# Patient Record
Sex: Male | Born: 1975 | Race: White | Hispanic: No | Marital: Married | Smoking: Former smoker
Health system: Southern US, Community
[De-identification: ages and names within clinical notes are randomized; demographics above are authoritative.]

## PROBLEM LIST (undated history)

## (undated) DIAGNOSIS — K219 Gastro-esophageal reflux disease without esophagitis: Secondary | ICD-10-CM

## (undated) DIAGNOSIS — R519 Headache, unspecified: Secondary | ICD-10-CM

## (undated) DIAGNOSIS — R51 Headache: Secondary | ICD-10-CM

## (undated) HISTORY — PX: JOINT REPLACEMENT: SHX530

## (undated) HISTORY — DX: Headache, unspecified: R51.9

## (undated) HISTORY — DX: Headache: R51

## (undated) HISTORY — DX: Gastro-esophageal reflux disease without esophagitis: K21.9

---

## 1999-04-24 ENCOUNTER — Emergency Department (HOSPITAL_COMMUNITY): Admission: EM | Admit: 1999-04-24 | Discharge: 1999-04-24 | Payer: Self-pay | Admitting: *Deleted

## 2000-09-08 ENCOUNTER — Other Ambulatory Visit: Admission: RE | Admit: 2000-09-08 | Discharge: 2000-09-08 | Payer: Self-pay | Admitting: Orthopaedic Surgery

## 2001-05-27 ENCOUNTER — Emergency Department (HOSPITAL_COMMUNITY): Admission: EM | Admit: 2001-05-27 | Discharge: 2001-05-27 | Payer: Self-pay | Admitting: Emergency Medicine

## 2001-05-27 ENCOUNTER — Encounter: Payer: Self-pay | Admitting: Emergency Medicine

## 2008-06-03 ENCOUNTER — Ambulatory Visit (HOSPITAL_COMMUNITY): Admission: RE | Admit: 2008-06-03 | Discharge: 2008-06-03 | Payer: Self-pay | Admitting: Family Medicine

## 2010-12-30 ENCOUNTER — Emergency Department (HOSPITAL_BASED_OUTPATIENT_CLINIC_OR_DEPARTMENT_OTHER)
Admission: EM | Admit: 2010-12-30 | Discharge: 2010-12-30 | Disposition: A | Discharge status: Home / Self Care | Payer: BC Managed Care – PPO | Attending: Emergency Medicine | Admitting: Emergency Medicine

## 2010-12-30 ENCOUNTER — Emergency Department (INDEPENDENT_AMBULATORY_CARE_PROVIDER_SITE_OTHER): Payer: BC Managed Care – PPO

## 2010-12-30 ENCOUNTER — Encounter: Payer: Self-pay | Admitting: *Deleted

## 2010-12-30 DIAGNOSIS — F17200 Nicotine dependence, unspecified, uncomplicated: Secondary | ICD-10-CM | POA: Insufficient documentation

## 2010-12-30 DIAGNOSIS — R1032 Left lower quadrant pain: Secondary | ICD-10-CM | POA: Insufficient documentation

## 2010-12-30 DIAGNOSIS — R112 Nausea with vomiting, unspecified: Secondary | ICD-10-CM

## 2010-12-30 LAB — COMPREHENSIVE METABOLIC PANEL
BUN: 11 mg/dL (ref 6–23)
Calcium: 9.8 mg/dL (ref 8.4–10.5)
GFR calc Af Amer: >60 mL/min (ref >60)
Glucose, Bld: 97 mg/dL (ref 70–99)
Sodium: 137 mEq/L (ref 135–145)
Total Protein: 7.3 g/dL (ref 6.0–8.3)

## 2010-12-30 LAB — URINALYSIS, ROUTINE W REFLEX MICROSCOPIC
Leukocytes, UA: NEGATIVE
Nitrite: NEGATIVE
Specific Gravity, Urine: >1.046 — ABNORMAL HIGH (ref 1.005–1.030)
pH: 6.5 (ref 5.0–8.0)

## 2010-12-30 LAB — CBC
Hemoglobin: 15.7 g/dL (ref 13.0–17.0)
MCH: 30.5 pg (ref 26.0–34.0)
MCHC: 35.6 g/dL (ref 30.0–36.0)

## 2010-12-30 MED ORDER — SODIUM CHLORIDE 0.9 % IV BOLUS (SEPSIS)
1000.0000 mL | Freq: Once | INTRAVENOUS | Status: AC
  Administered 2010-12-30: 1000 mL via INTRAVENOUS

## 2010-12-30 MED ORDER — IOHEXOL 300 MG/ML  SOLN
100.0000 mL | Freq: Once | INTRAMUSCULAR | Status: AC | PRN
  Administered 2010-12-30: 100 mL via INTRAVENOUS

## 2010-12-30 MED ORDER — HYDROMORPHONE HCL 1 MG/ML IJ SOLN: 1.0000 mg | Freq: Once | INTRAMUSCULAR | Status: DC

## 2010-12-30 MED ORDER — SODIUM CHLORIDE 0.9 % IV BOLUS (SEPSIS)
1000.0000 mL | Freq: Once | INTRAVENOUS | Status: DC
  Administered 2010-12-30: 1000 mL via INTRAVENOUS

## 2010-12-30 MED ORDER — ONDANSETRON HCL 4 MG/2ML IJ SOLN
4.0000 mg | Freq: Once | INTRAMUSCULAR | Status: AC
  Administered 2010-12-30: 4 mg via INTRAVENOUS

## 2010-12-30 MED ORDER — ONDANSETRON HCL 4 MG PO TABS: 8.0000 mg | ORAL_TABLET | Freq: Three times a day (TID) | ORAL | Status: AC | PRN

## 2010-12-30 MED ORDER — ONDANSETRON HCL 4 MG/2ML IJ SOLN
4.0000 mg | Freq: Once | INTRAMUSCULAR | Status: AC
Start: 2010-12-30 — End: 2010-12-30
  Administered 2010-12-30: 4 mg via INTRAVENOUS

## 2010-12-30 NOTE — ED Notes (Signed)
Pt c/o generalized abd pain with n/v x 1 day 

## 2010-12-30 NOTE — ED Notes (Signed)
Dr. Campos at bedside   

## 2010-12-30 NOTE — ED Notes (Signed)
Pt states he is unable to urinate. Informed pt to notify staff ASAP, as a specimen is needed.

## 2010-12-30 NOTE — ED Provider Notes (Signed)
History     CSN: [id number] Arrival date & time: 12/30/2010  8:20 PM  Chief Complaint  Patient presents with  . Emesis  . Abdominal Pain   Patient is a 35 y.o. male presenting with vomiting. The history is provided by the patient.  Emesis  This is a new problem. Episode onset: 1 day ago. The problem occurs 5 to 10 times per day. The problem has not changed since onset.The emesis has an appearance of stomach contents (Nonbloody and nonbilious). There has been no fever. Associated symptoms include abdominal pain. Pertinent negatives include no chills, no diarrhea, no fever and no headaches. Associated symptoms comments: Denies dysuria or penile discharge.  Denies flank pain or fever or chills. .    History reviewed. No pertinent past medical history.  Past Surgical History  Procedure Date  . Joint replacement     History reviewed. No pertinent family history.  History  Substance Use Topics  . Smoking status: Current Everyday Smoker -- 0.5 packs/day  . Smokeless tobacco: Not on file  . Alcohol Use: No      Review of Systems  Constitutional: Negative for fever and chills.  Gastrointestinal: Positive for vomiting and abdominal pain. Negative for diarrhea.  Neurological: Negative for headaches.  All other systems reviewed and are negative.    Physical Exam  BP 114/77  Pulse 86  Temp(Src) 98.7 F (37.1 C) (Oral)  Resp 16  Wt 155 lb (70.308 kg)  Physical Exam  Nursing note and vitals reviewed. Constitutional: He is oriented to person, place, and time. He appears well-developed and well-nourished.  HENT:  Head: Normocephalic and atraumatic.       HEENT his membranes are dry   Eyes: EOM are normal.  Neck: Normal range of motion.  Cardiovascular: Normal rate, regular rhythm, normal heart sounds and intact distal pulses.   Pulmonary/Chest: Effort normal and breath sounds normal. No respiratory distress.  Abdominal: Soft. He exhibits no distension. There is no  tenderness.  Musculoskeletal: Normal range of motion.  Neurological: He is alert and oriented to person, place, and time.  Skin: Skin is warm and dry.  Psychiatric: He has a normal mood and affect. Judgment normal.    ED Course  Procedures  MDM I personally reviewed the labs and radiographs from today's visit.  Given mild left-sided abdominal tenderness a CT scan was performed.  Patient's symptoms are improved at this time.  His vital signs are normal. Pt to be discharged home with antibiotics and instructions for ongoing PO hydration. Feels much better at this time  Results for orders placed during the hospital encounter of 12/30/10  CBC      Component Value Range   WBC 8.4  4.0 - 10.5 (K/uL)   RBC 5.15  4.22 - 5.81 (MIL/uL)   Hemoglobin 15.7  13.0 - 17.0 (g/dL)   HCT 47.4  25.9 - 56.3 (%)   MCV 85.6  78.0 - 100.0 (fL)   MCH 30.5  26.0 - 34.0 (pg)   MCHC 35.6  30.0 - 36.0 (g/dL)   RDW 87.5  64.3 - 32.9 (%)   Platelets 219  150 - 400 (K/uL)  COMPREHENSIVE METABOLIC PANEL      Component Value Range   Sodium 137  135 - 145 (mEq/L)   Potassium 3.6  3.5 - 5.1 (mEq/L)   Chloride 101  96 - 112 (mEq/L)   CO2 26  19 - 32 (mEq/L)   Glucose, Bld 97  70 - 99 (mg/dL)  BUN 11  6 - 23 (mg/dL)   Creatinine, Ser 1.61  0.50 - 1.35 (mg/dL)   Calcium 9.8  8.4 - 09.6 (mg/dL)   Total Protein 7.3  6.0 - 8.3 (g/dL)   Albumin 3.8  3.5 - 5.2 (g/dL)   AST 18  0 - 37 (U/L)   ALT 11  0 - 53 (U/L)   Alkaline Phosphatase 65  39 - 117 (U/L)   Total Bilirubin 0.5  0.3 - 1.2 (mg/dL)   GFR calc non Af Amer >60  >60 (mL/min)   GFR calc Af Amer >60  >60 (mL/min)   Ct Abdomen Pelvis W Contrast  12/30/2010  *RADIOLOGY REPORT*  Clinical Data: Left lower quadrant abdominal pain, nausea and vomiting.  CT ABDOMEN AND PELVIS WITH CONTRAST  Technique:  Multidetector CT imaging of the abdomen and pelvis was performed following the standard protocol during bolus administration of intravenous contrast.  Contrast: 100  mL of Omnipaque 300 IV contrast  Comparison: None.  Findings: The visualized lung bases are clear.  The liver and spleen are unremarkable in appearance.  The gallbladder is within normal limits.  The pancreas and adrenal glands are unremarkable.  The kidneys are within normal limits bilaterally; no hydronephrosis or perinephric stranding is seen.  No free fluid is identified.  The small bowel is unremarkable in appearance.  The stomach is within normal limits.  No acute vascular abnormalities are seen.  The appendix is normal in caliber and contains air, without evidence for appendicitis.  The colon is unremarkable in appearance.  The bladder is mild distended and grossly unremarkable in appearance; a small urachal remnant is incidentally seen. Scattered calcification is noted within the prostate; the prostate remains normal in size.  No inguinal lymphadenopathy is seen.  No acute osseous abnormalities are identified.  IMPRESSION: Unremarkable CT of the abdomen and pelvis.  Original Report Authenticated By: Tonia Ghent, M.D.        Lyanne Co, MD 12/30/10 [phone]

## 2010-12-30 NOTE — ED Notes (Signed)
Pt transported to CT ?

## 2011-06-11 DIAGNOSIS — H9209 Otalgia, unspecified ear: Secondary | ICD-10-CM | POA: Insufficient documentation

## 2011-06-17 DIAGNOSIS — Z720 Tobacco use: Secondary | ICD-10-CM | POA: Insufficient documentation

## 2012-12-17 IMAGING — CT CT ABD-PELV W/ CM
2 of 4 series · 16 of 46 positions shown, 18 images · IV contrast (APPLIED)
Comparison: None.

CLINICAL DATA: Left lower quadrant abdominal pain, nausea and
vomiting.

CT ABDOMEN AND PELVIS WITH CONTRAST
TECHNIQUE: Multidetector CT imaging of the abdomen and pelvis was
performed following the standard protocol during bolus
administration of intravenous contrast.
Contrast: 100 mL of Omnipaque 300 IV contrast

[Series 2: abd/pelvis 5.0 b31f · axial · 0.67mm/px · z∈[+844,+1264]mm · 13 of 94 slices shown, 15 images]
[im 5/94  soft-tissue]
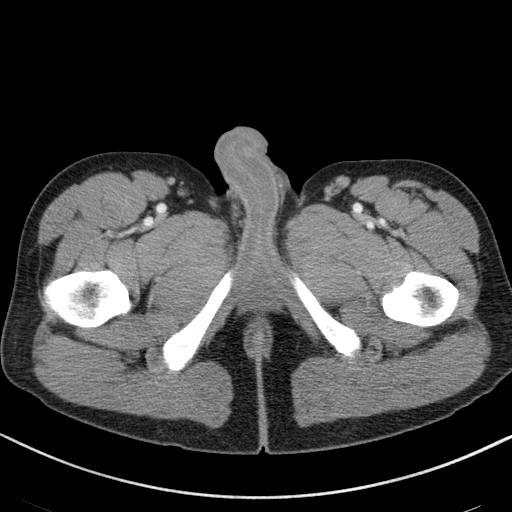
[im 5/94  bone]
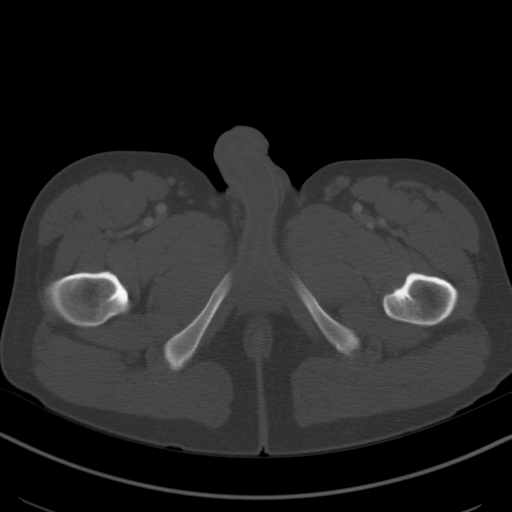
[im 13/94  soft-tissue]
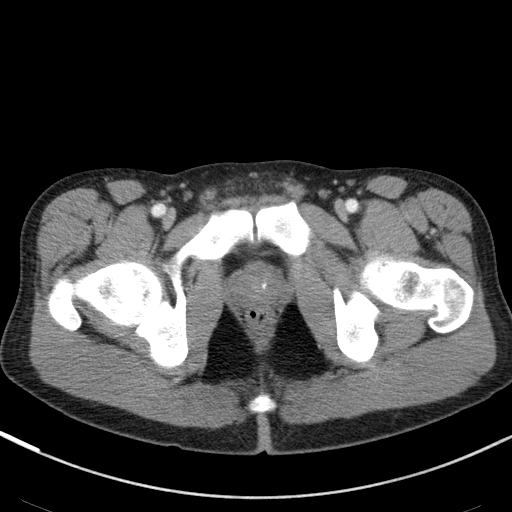
[im 21/94  soft-tissue]
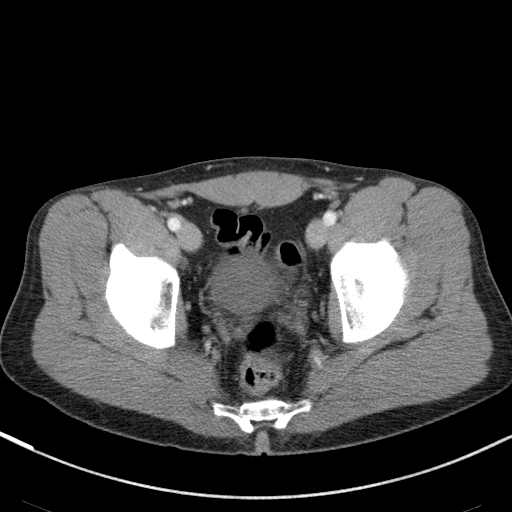
[im 25/94  soft-tissue]
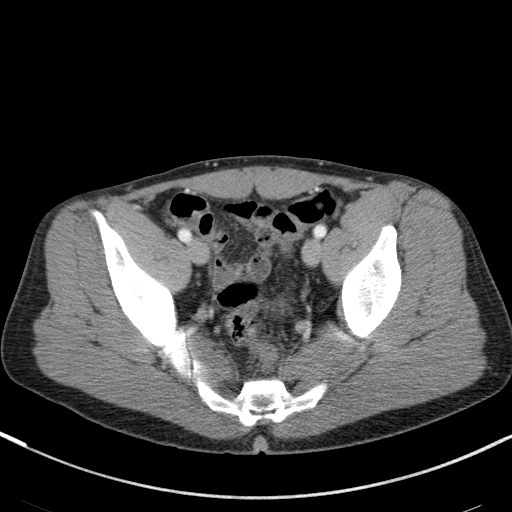
[im 33/94  soft-tissue]
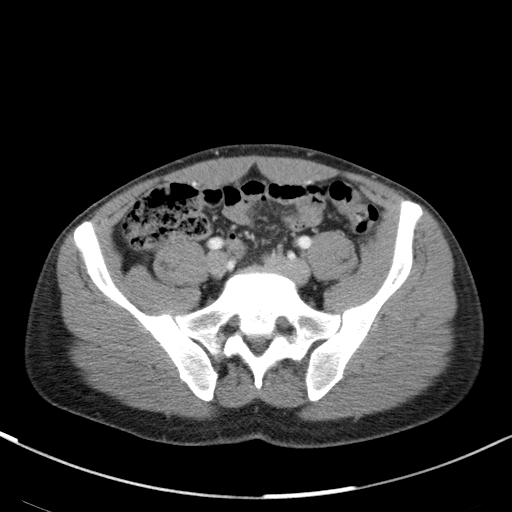
[im 41/94  soft-tissue]
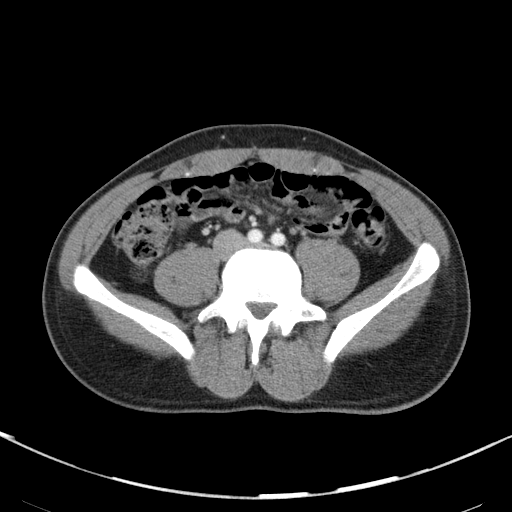
[im 49/94  soft-tissue]
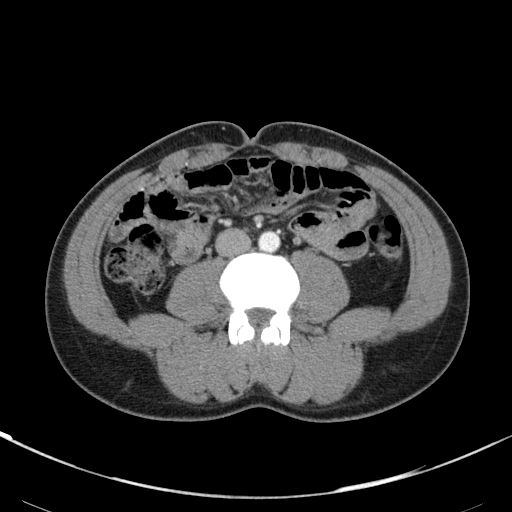
[im 53/94  soft-tissue]
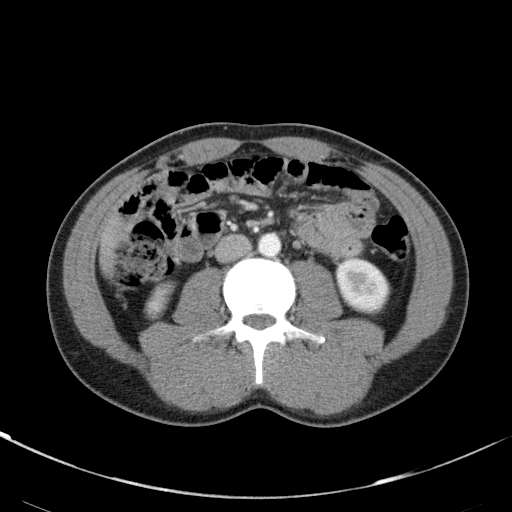
[im 61/94  soft-tissue]
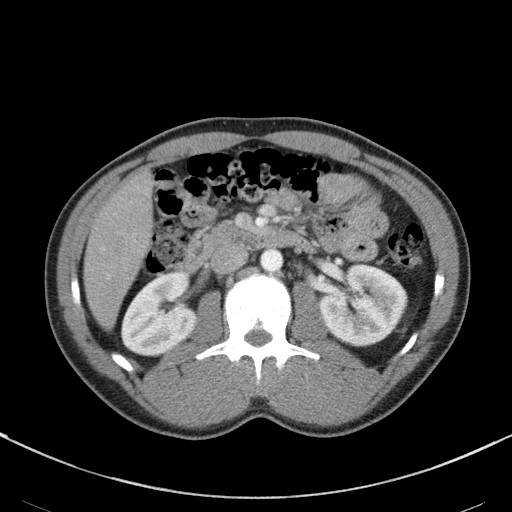
[im 61/94  bone]
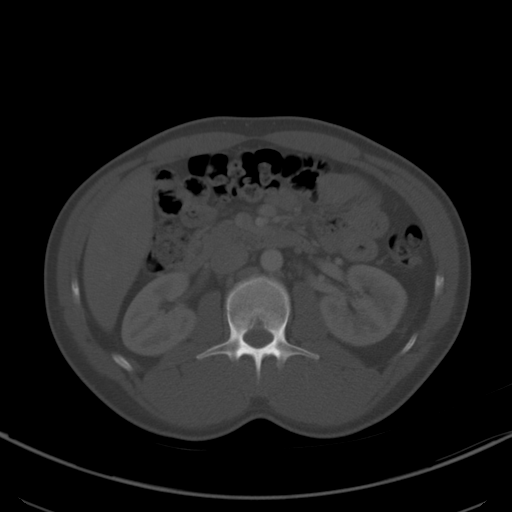
[im 69/94  soft-tissue]
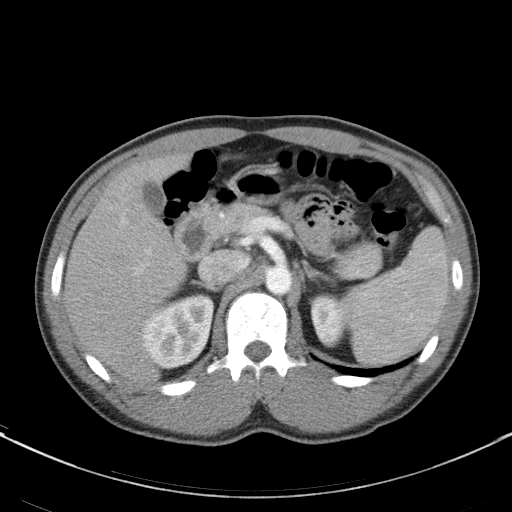
[im 73/94  soft-tissue]
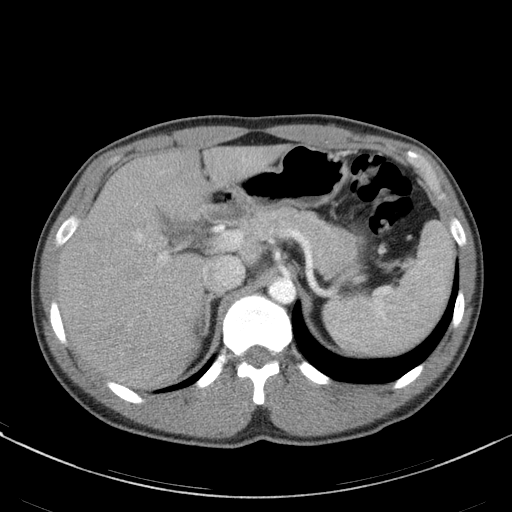
[im 81/94  soft-tissue]
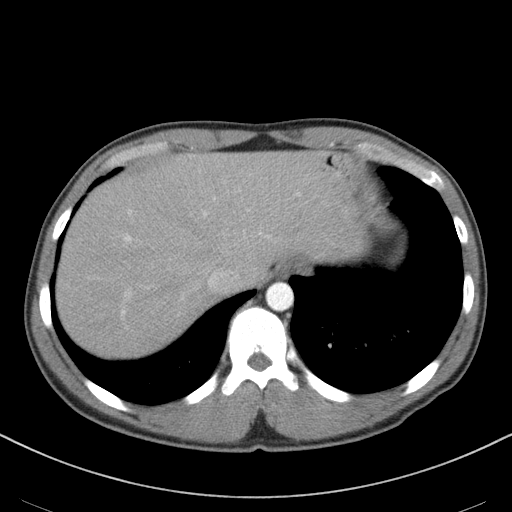
[im 89/94  soft-tissue]
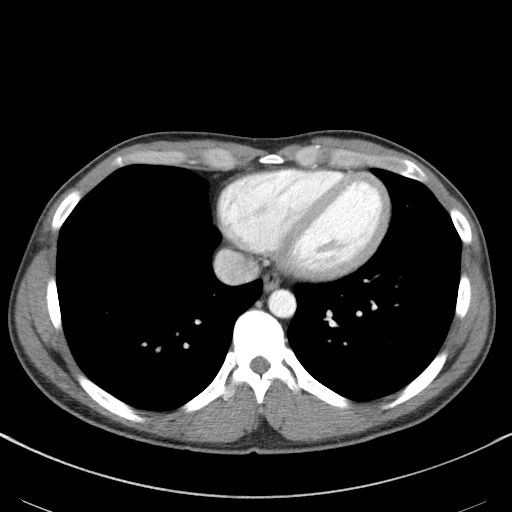

[Series 5: abd/pelvis 3.0 coronal · coronal · 0.68mm/px · 3 of 71 slices shown]
[im 24/71  soft-tissue]
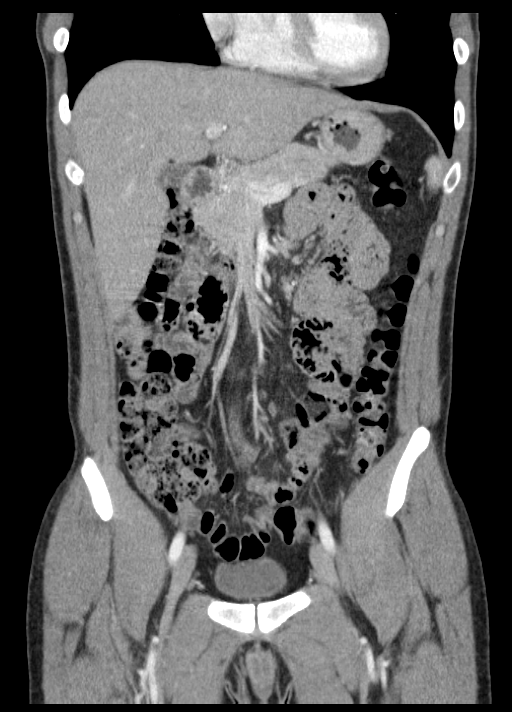
[im 32/71  soft-tissue]
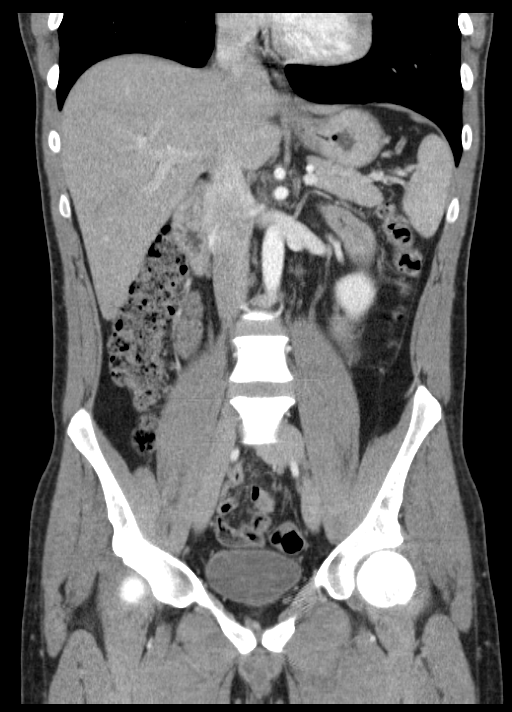
[im 39/71  soft-tissue]
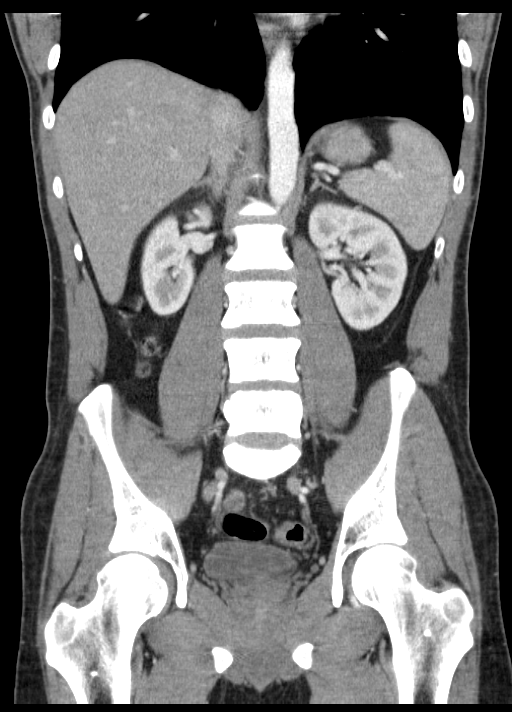

[16 of 46 positions shown; findings below may reference images not displayed]

FINDINGS: The visualized lung bases are clear.

The liver and spleen are unremarkable in appearance.  The
gallbladder is within normal limits.  The pancreas and adrenal
glands are unremarkable.  The kidneys are within normal limits
bilaterally; no hydronephrosis or perinephric stranding is seen.

No free fluid is identified.  The small bowel is unremarkable in
appearance.  The stomach is within normal limits.  No acute
vascular abnormalities are seen.

The appendix is normal in caliber and contains air, without
evidence for appendicitis.  The colon is unremarkable in
appearance.

The bladder is mild distended and grossly unremarkable in
appearance; a small urachal remnant is incidentally seen.
Scattered calcification is noted within the prostate; the prostate
remains normal in size.  No inguinal lymphadenopathy is seen.

No acute osseous abnormalities are identified.
IMPRESSION: Unremarkable CT of the abdomen and pelvis.

## 2015-05-30 ENCOUNTER — Ambulatory Visit
Admission: RE | Admit: 2015-05-30 | Discharge: 2015-05-30 | Disposition: A | Discharge status: Home / Self Care | Payer: 59 | Source: Ambulatory Visit | Attending: Cardiology | Admitting: Cardiology

## 2015-05-30 ENCOUNTER — Other Ambulatory Visit: Payer: Self-pay | Admitting: Cardiology

## 2015-05-30 DIAGNOSIS — R079 Chest pain, unspecified: Secondary | ICD-10-CM

## 2015-06-04 LAB — LIPID PANEL
CHOLESTEROL: 218 — AB (ref 0–200)
HDL: 43 (ref 35–70)
TRIGLYCERIDES: 86 (ref 40–160)

## 2015-06-04 LAB — BASIC METABOLIC PANEL: GLUCOSE: 91

## 2015-06-04 LAB — TSH: TSH: 1.67 (ref 0.41–5.90)

## 2017-05-17 IMAGING — CR DG CHEST 2V
2 series · 2 of 2 positions shown · non-contrast
Comparison: 06/03/2008

CLINICAL DATA: Anterior chest pain for 2 weeks

EXAM:
CHEST - 2 VIEW

[w chest pa]
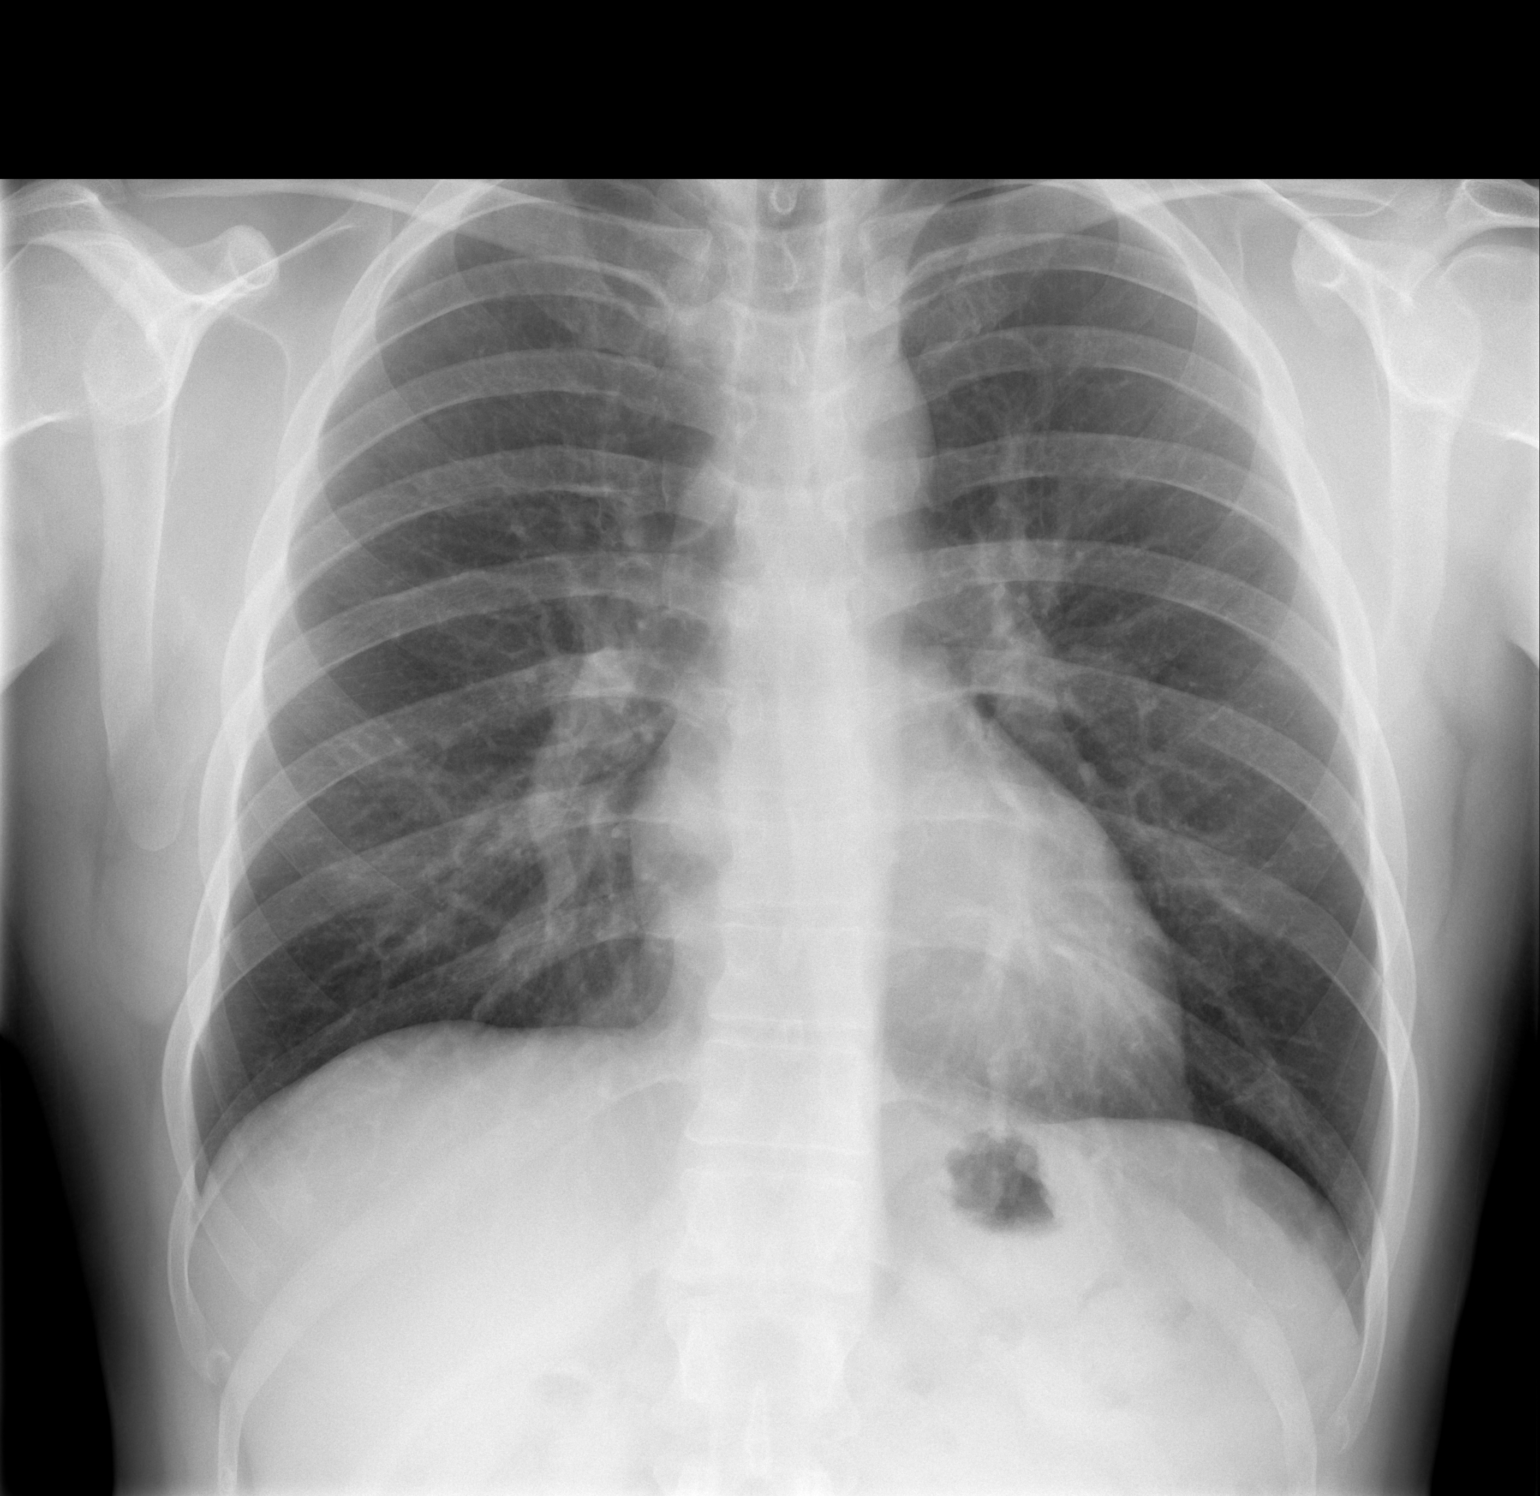

[w chest lat]
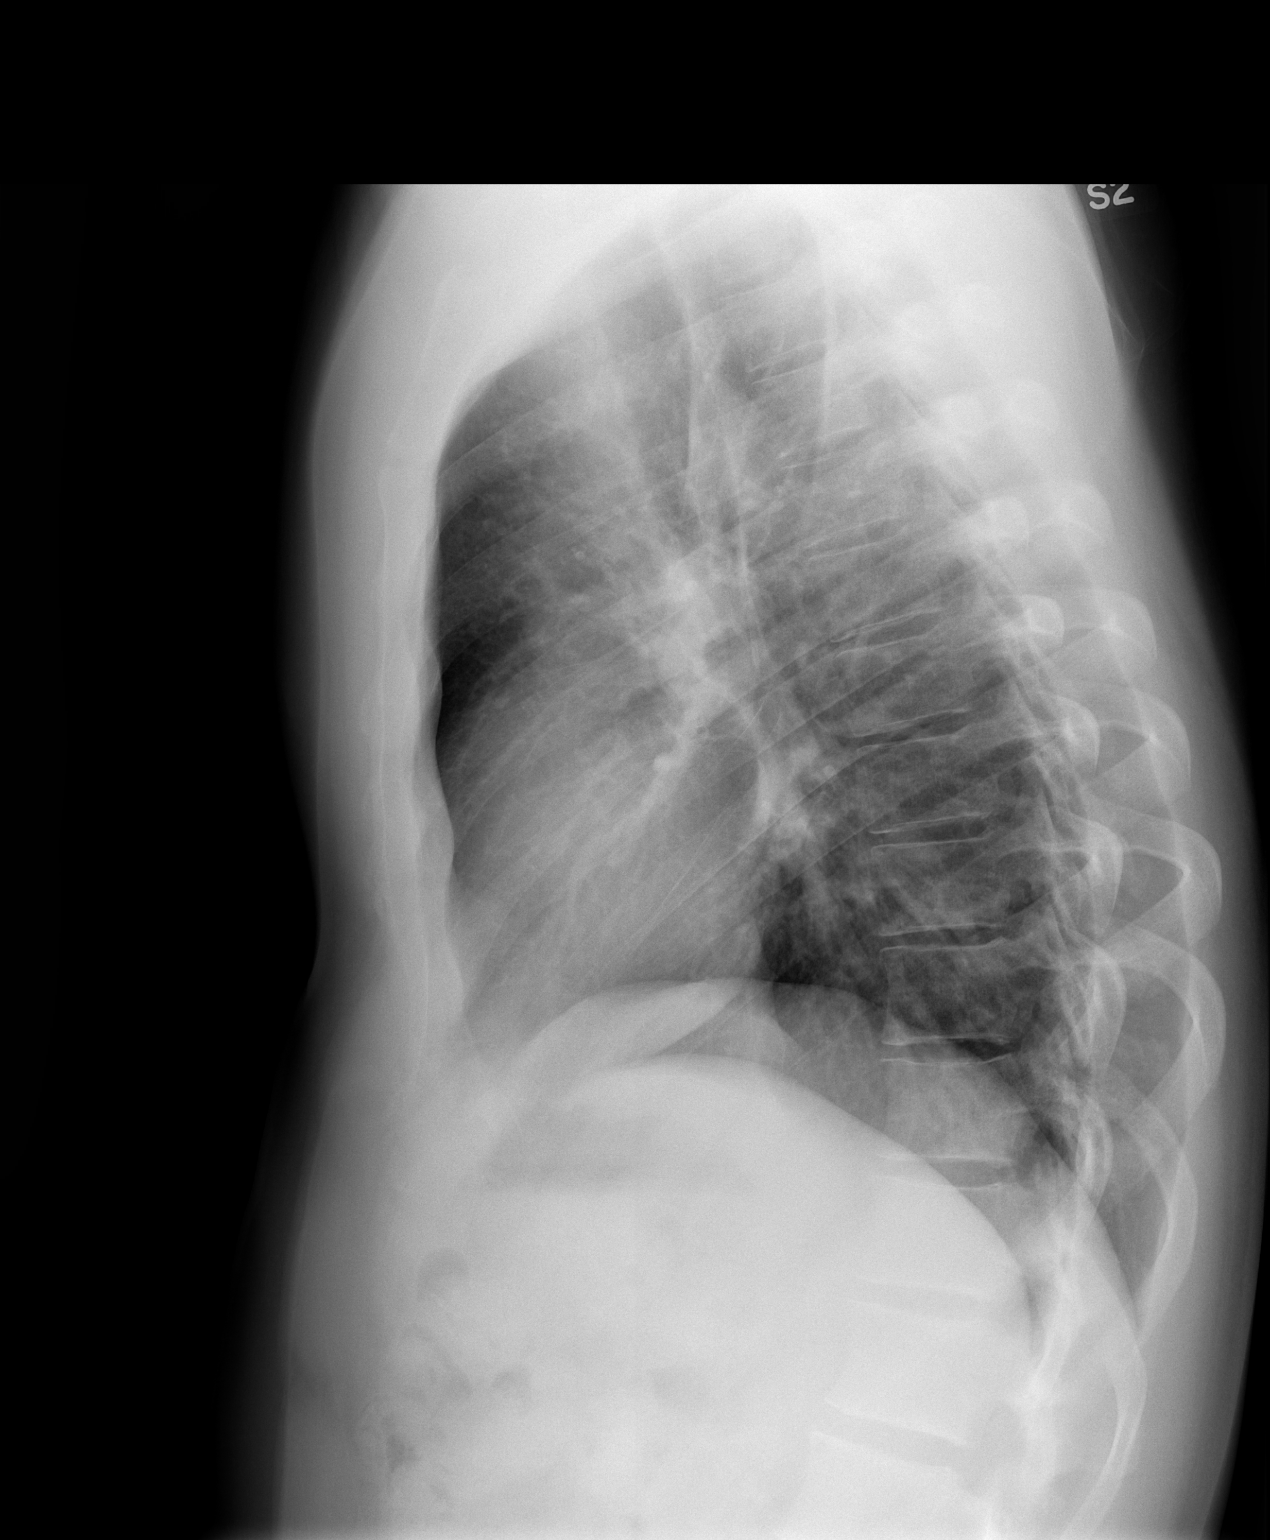

[2 of 2 positions shown; findings below may reference images not displayed]

FINDINGS: The heart size and mediastinal contours are within normal limits.
Both lungs are clear. The visualized skeletal structures are
unremarkable.
IMPRESSION: No active disease.

## 2018-04-12 ENCOUNTER — Encounter: Payer: Self-pay | Admitting: *Deleted

## 2018-04-13 ENCOUNTER — Other Ambulatory Visit: Payer: Self-pay

## 2018-04-13 ENCOUNTER — Encounter: Payer: Self-pay | Admitting: Family Medicine

## 2018-04-13 ENCOUNTER — Encounter: Payer: Self-pay | Admitting: *Deleted

## 2018-04-13 ENCOUNTER — Ambulatory Visit: Payer: 59 | Admitting: Family Medicine

## 2018-04-13 VITALS — BP 125/76 | HR 74 | Temp 98.5°F | Resp 16 | Ht 69.5 in | Wt 184.0 lb

## 2018-04-13 DIAGNOSIS — R519 Headache, unspecified: Secondary | ICD-10-CM | POA: Insufficient documentation

## 2018-04-13 DIAGNOSIS — Z720 Tobacco use: Secondary | ICD-10-CM

## 2018-04-13 DIAGNOSIS — Z79899 Other long term (current) drug therapy: Secondary | ICD-10-CM | POA: Diagnosis not present

## 2018-04-13 DIAGNOSIS — Z7689 Persons encountering health services in other specified circumstances: Secondary | ICD-10-CM

## 2018-04-13 DIAGNOSIS — G4763 Sleep related bruxism: Secondary | ICD-10-CM

## 2018-04-13 DIAGNOSIS — K219 Gastro-esophageal reflux disease without esophagitis: Secondary | ICD-10-CM

## 2018-04-13 DIAGNOSIS — R0789 Other chest pain: Secondary | ICD-10-CM | POA: Insufficient documentation

## 2018-04-13 DIAGNOSIS — E663 Overweight: Secondary | ICD-10-CM | POA: Insufficient documentation

## 2018-04-13 DIAGNOSIS — R51 Headache: Secondary | ICD-10-CM | POA: Diagnosis not present

## 2018-04-13 HISTORY — DX: Other long term (current) drug therapy: Z79.899

## 2018-04-13 HISTORY — DX: Other chest pain: R07.89

## 2018-04-13 LAB — VITAMIN B12: Vitamin B-12: 333 pg/mL (ref 211–911)

## 2018-04-13 LAB — SEDIMENTATION RATE: SED RATE: 8 mm/h (ref 0–15)

## 2018-04-13 LAB — VITAMIN D 25 HYDROXY (VIT D DEFICIENCY, FRACTURES): VITD: 32.83 ng/mL (ref 30.00–100.00)

## 2018-04-13 MED ORDER — CYCLOBENZAPRINE HCL 10 MG PO TABS: 10.0000 mg | ORAL_TABLET | Freq: Every day | ORAL | 2 refills | Status: DC

## 2018-04-13 NOTE — Patient Instructions (Addendum)
Start 1/2 tab 1 hour before bed. If that works, stay on 1/2 tab. If it does not and does not cause too much sedation then can increase to the full tab.    Teeth Grinding Teeth grinding is when you grind or clench your teeth. This condition is also called bruxism. You can grind or clench your teeth without knowing that you are doing it. It can be done during the day or at night while you are sleeping. This can lead to tooth damage and jaw pain. This condition is common. What are the causes? The cause of this condition is not known. However, bruxism may be triggered by:  Improperly aligned teeth (malocclusion).  Stress or worry.  Earaches.  Allergies.  Some medicines.  Upper respiratory infections.  Sleep disorders.  What increases the risk? This condition is more likely to develop in:  Children.  People who have a family history of teeth grinding.  People who consume nicotine, caffeine, or excess amounts of alcoholbefore sleeping.  People who are stressed.  What are the signs or symptoms? Symptoms of this condition include:  Earaches.  Teeth that are sensitive to heat, cold, and sweetness.  Damaged teeth.  Jaw pain or facial pain.  Headaches.  Muscle spasms.  Jaw problems, such as hearing a clicking sound when you open or close your mouth.  Difficulty sleeping.  Difficulty eating.  In some cases, there are no symptoms. How is this diagnosed? This condition is diagnosed with a medical history and dental exam. To rule out other conditions, you may also have other tests, including:  X-rays.  Blood tests.  How is this treated? There is no cure for this condition, but treatment can help to control your symptoms and prevent further damage to your teeth. Treatment may include:  A nightguard. This is a mouthguard that is fitted to your teeth. It places a barrier between your top teeth and your bottom teeth so that you grind on the nightguard instead.  A  mouthguard that moves your lower jaw forward (mandibular advancement devices).  Dental correction of misaligned teeth. This may include braces.  Medicine to relax your jaw muscles.  Methods to reduce your stress, such as: ? Hypnosis. ? Psychiatric treatment or counseling. ? Relaxation techniques to use before going to sleep.  Follow these instructions at home: Lifestyle  Try to reduce your stress, such as with exercise, yoga, or meditation. Talk with your health care provider if you need help to reduce your stress.  Try to get eight hours of sleep every night. Sleep in a cool, dark, and quiet room. Keep computers and work out of the bedroom.  Avoid sleeping on your back. Try sleeping on your side or your abdomen.  Avoid: ? Chewing gum. ? Eating tough or hard foods, such as candy. ? Alcohol, caffeine, and nicotine, especially before bedtime.  Drink enough fluid to keep your urine clear or pale yellow.  Try to keep your face, mouth, and jaw muscles relaxed. General instructions  If you were given a mouthguard, wear it as directed by your health care provider.  Take medicines only as directed by your health care provider.  Your health care provider may recommend applying a warm compress to your face to help relax your jaw muscles. Do this as directed by your health care provider.  Keep all follow-up visits as directed by your health care provider. This is important.  Do jaw exercises as directed by your health care provider. Contact a health  care provider if:  Your symptoms get worse.  You have new symptoms.  You have trouble eating.  You have trouble opening your mouth. This information is not intended to replace advice given to you by your health care provider. Make sure you discuss any questions you have with your health care provider. Document Released: 04/29/2003 Document Revised: 12/25/2015 Document Reviewed: 04/22/2014 Elsevier Interactive Patient Education   2018 [employer].   Smoking Tobacco Information Smoking tobacco will very likely harm your health. Tobacco contains a poisonous (toxic), colorless chemical called nicotine. Nicotine affects the brain and makes tobacco addictive. This change in your brain can make it hard to stop smoking. Tobacco also has other toxic chemicals that can hurt your body and raise your risk of many cancers. How can smoking tobacco affect me? Smoking tobacco can increase your chances of having serious health conditions, such as:  Cancer. Smoking is most commonly associated with lung cancer, but can lead to cancer in other parts of the body.  Chronic obstructive pulmonary disease (COPD). This is a long-term lung condition that makes it hard to breathe. It also gets worse over time.  High blood pressure (hypertension), heart disease, stroke, or heart attack.  Lung infections, such as pneumonia.  Cataracts. This is when the lenses in the eyes become clouded.  Digestive problems. This may include peptic ulcers, heartburn, and gastroesophageal reflux disease (GERD).  Oral health problems, such as gum disease and tooth loss.  Loss of taste and smell.  Smoking can affect your appearance by causing:  Wrinkles.  Yellow or stained teeth, fingers, and fingernails.  Smoking tobacco can also affect your social life.  Many workplaces, [employer], hotels, and public places are tobacco-free. This means that you may experience challenges in finding places to smoke when away from home.  The cost of a smoking habit can be expensive. Expenses for someone who smokes come in two ways: ? You spend money on a regular basis to buy tobacco. ? Your health care costs in the long-term are higher if you smoke.  Tobacco smoke can also affect the health of those around you. Children of smokers have greater chances of: ? Sudden infant death syndrome (SIDS). ? Ear infections. ? Lung infections.  What lifestyle changes can be  made?  Do not start smoking. Quit if you already do.  To quit smoking: ? Make a plan to quit smoking and commit yourself to it. Look for programs to help you and ask your health care provider for recommendations and ideas. ? Talk with your health care provider about using nicotine replacement medicines to help you quit. Medicine replacement medicines include gum, lozenges, patches, sprays, or pills. ? Do not replace cigarette smoking with electronic cigarettes, which are commonly called e-cigarettes. The safety of e-cigarettes is not known, and some may contain harmful chemicals. ? Avoid places, people, or situations that tempt you to smoke. ? If you try to quit but return to smoking, don't give up hope. It is very common for people to try a number of times before they fully succeed. When you feel ready again, give it another try.  Quitting smoking might affect the way you eat as well as your weight. Be prepared to monitor your eating habits. Get support in planning and following a healthy diet.  Ask your health care provider about having regular tests (screenings) to check for cancer. This may include blood tests, imaging tests, and other tests.  Exercise regularly. Consider taking walks, joining a gym,  or doing yoga or exercise classes.  Develop skills to manage your stress. These skills include meditation. What are the benefits of quitting smoking? By quitting smoking, you may:  Lower your risk of getting cancer and other diseases caused by smoking.  Live longer.  Breathe better.  Lower your blood pressure and heart rate.  Stop your addiction to tobacco.  Stop creating secondhand smoke that hurts other people.  Improve your sense of taste and smell.  Look better over time, due to having fewer wrinkles and less staining.  What can happen if changes are not made? If you do not stop smoking, you may:  Get cancer and other diseases.  Develop COPD or other long-term (chronic)  lung conditions.  Develop serious problems with your heart and blood vessels (cardiovascular system).  Need more tests to screen for problems caused by smoking.  Have higher, long-term healthcare costs from medicines or treatments related to smoking.  Continue to have worsening changes in your lungs, mouth, and nose.  Where to find support: To get support to quit smoking, consider:  Asking your health care provider for more information and resources.  Taking classes to learn more about quitting smoking.  Looking for local organizations that offer resources about quitting smoking.  Joining a support group for people who want to quit smoking in your local community.  Where to find more information: You may find more information about quitting smoking from:  HelpGuide.org: www.helpguide.org/articles/addictions/how-to-quit-smoking.htm  BankR[employer]uySmokefree.gov: smokefree.gov  American Lung Association: www.lung.org  Contact a health care provider if:  You have problems breathing.  Your lips, nose, or fingers turn blue.  You have chest pain.  You are coughing up blood.  You feel faint or you pass out.  You have other noticeable changes that cause you to worry. Summary  Smoking tobacco can negatively affect your health, the health of those around you, your finances, and your social life.  Do not start smoking. Quit if you already do. If you need help quitting, ask your health care provider.  Think about joining a support group for people who want to quit smoking in your local community. There are many effective programs that will help you to quit this behavior. This information is not intended to replace advice given to you by your health care provider. Make sure you discuss any questions you have with your health care provider. Document Released: 05/11/2016 Document Revised: 05/11/2016 Document Reviewed: 05/11/2016 Elsevier Interactive Patient Education  [employer]2018 Elsevier  Inc.  Please help us help you:  We are [facility]. Below you will find basic instructions that you may need to access in the future. Please help us help you by reading the instructions, which cover many of the frequent questions we experience.   Prescription refills and request:  -In order to allow more efficient response time, please call your pharmacy for all refills. They will forward the request electronically to us. This allows for the quickest possible response. Request left on a nurse line can take longer to refill, since these are checked as time allows between office patients and other phone calls.  - refill request can take up to 3-5 working days to complete.  - If request is sent electronically and request is appropiate, it is usually completed in 1-2 business days.  - all patients will need to be seen routinely for all chronic medical conditions requiring prescription medications (see follow-up below). If you are overdue  for follow up on your condition, you will be asked to make an appointment and we will call in enough medication to cover you until your appointment (up to 30 days).  - all controlled substances will require a face to face visit to request/refill.  - if you desire your prescriptions to go through a new pharmacy, and have an active script at original pharmacy, you will need to call your pharmacy and have scripts transferred to new pharmacy. This is completed between the pharmacy locations and not by your provider.    Results: If any images or labs were ordered, it can take up to 1 week to get results depending on the test ordered and the lab/facility running and resulting the test. - Normal or stable results, which do not need further discussion, may be released to your mychart immediately with attached note to you. A call may not be generated for normal results. Please make certain to sign up for mychart. If you have  questions on how to activate your mychart you can call the front office.  - If your results need further discussion, our office will attempt to contact you via phone, and if unable to reach you after 2 attempts, we will release your abnormal result to your mychart with instructions.  - All results will be automatically released in mychart after 1 week.  - Your provider will provide you with explanation and instruction on all relevant material in your results. Please keep in mind, results and labs may appear confusing or abnormal to the untrained eye, but it does not mean they are actually abnormal for you personally. If you have any questions about your results that are not covered, or you desire more detailed explanation than what was provided, you should make an appointment with your provider to do so.   Our office handles many outgoing and incoming calls daily. If we have not contacted you within 1 week about your results, please check your mychart to see if there is a message first and if not, then contact our office.  In helping with this matter, you help decrease call volume, and therefore allow [place] to be able to respond to patients needs more efficiently.   Acute office visits (sick visit):  An acute visit is intended for a new problem and are scheduled in shorter time slots to allow schedule openings for patients with new problems. This is the appropriate visit to discuss a new problem. Problems will not be addressed by phone call or Echart message. Appointment is needed if requesting treatment. In order to provide you with excellent quality medical care with proper time for you to explain your problem, have an exam and receive treatment with instructions, these appointments should be limited to one new problem per visit. If you experience a new problem, in which you desire to be addressed, please make an acute office visit, we save openings on the schedule to accommodate you. Please do not save your  new problem for any other type of visit, let us take care of it properly and quickly for you.   Follow up visits:  Depending on your condition(s) your provider will need to see you routinely in order to provide you with quality care and prescribe medication(s). Most chronic conditions (Example: hypertension, Diabetes, depression/anxiety... etc), require visits a couple times a year. Your provider will instruct you on proper follow up for your personal medical conditions and history. Please make certain to make follow up appointments for your  condition as instructed. Failing to do so could result in lapse in your medication treatment/refills. If you request a refill, and are overdue to be seen on a condition, we will always provide you with a 30 day script (once) to allow you time to schedule.    Medicare wellness (well visit): - we have a wonderful Nurse Selena Batten), that will meet with you and provide you will yearly medicare wellness visits. These visits should occur yearly (can not be scheduled less than 1 calendar year apart) and cover preventive health, immunizations, advance directives and screenings you are entitled to yearly through your medicare benefits. Do not miss out on your entitled benefits, this is when medicare will pay for these benefits to be ordered for you.  These are strongly encouraged by your provider and is the appropriate type of visit to make certain you are up to date with all preventive health benefits. If you have not had your medicare wellness exam in the last 12 months, please make certain to schedule one by calling the office and schedule your medicare wellness with Selena Batten as soon as possible.   Yearly physical (well visit):  - Adults are recommended to be seen yearly for physicals. Check with your insurance and date of your last physical, most insurances require one calendar year between physicals. Physicals include all preventive health topics, screenings, medical exam and labs  that are appropriate for gender/age and history. You may have fasting labs needed at this visit. This is a well visit (not a sick visit), new problems should not be covered during this visit (see acute visit).  - Pediatric patients are seen more frequently when they are younger. Your provider will advise you on well child visit timing that is appropriate for your their age. - This is not a medicare wellness visit. Medicare wellness exams do not have an exam portion to the visit. Some medicare companies allow for a physical, some do not allow a yearly physical. If your medicare allows a yearly physical you can schedule the medicare wellness with our nurse Selena Batten and have your physical with your provider after, on the same day. Please check with insurance for your full benefits.   Late Policy/No Shows:  - all new patients should arrive 15-30 minutes earlier than appointment to allow [place] time  to  obtain all personal demographics,  insurance information and for you to complete office paperwork. - All established patients should arrive 10-15 minutes earlier than appointment time to update all information and be checked in .  - In our best efforts to run on time, if you are late for your appointment you will be asked to either reschedule or if able, we will work you back into the schedule. There will be a wait time to work you back in the schedule,  depending on availability.  - If you are unable to make it to your appointment as scheduled, please call 24 hours ahead of time to allow [place] to fill the time slot with someone else who needs to be seen. If you do not cancel your appointment ahead of time, you may be charged a no show fee.

## 2018-04-13 NOTE — Progress Notes (Signed)
Patient ID: Grant Hall, male  DOB: 03/11/1976, 42 y.o.   MRN: [id number] Patient Care Team    Relationship Specialty Notifications Start End  Natalia LeatherwoodKuneff, Journe Hallmark A, DO PCP - General Family Medicine  04/13/18   Yates DecampGanji, Jay, MD Consulting Physician Cardiology  04/13/18   Specialists, [facility]  Orthopedic Surgery  04/13/18     Chief Complaint  Patient presents with  . Establish Care    freq. Headaches    Subjective:  Grant Hall is a 42 y.o.  male present for new patient establishment. All past medical history, surgical history, allergies, family history, immunizations, medications and social history were updated in the electronic medical record today. All recent labs, ED visits and hospitalizations within the last year were reviewed.  Headaches: Patient reports he has had chronic headaches ever since he can remember.  However the last 3 to 4 years they were worsening and over the last few months have started to become every day.  He reports he has never been evaluated for his chronic headaches.  Headaches are always located in the right temple area.  Does feel pulsatile sensation at times with his headache in his right temple.  He does admit he grinds his teeth at night.  Reports his headaches occur every day, sometimes twice a day.  He is able to relieve his symptoms by taking 2 Advil.  He denies any symptoms associated with his headaches such as nausea, photophobia, vomit, visual changes or his ears.  He does not consume caffeine.  He states he drinks plenty of water daily to remain hydrated.  GERD/chest discomfort/tobacco abuse:  Patient reports a couple years ago he was evaluated for his chest discomfort by cardiology.  He reports a normal stress test done at that time by Dr. Jacinto HalimGanji.  Has been vaping the last 2 years.  He states his chest discomfort is intermittent midsternal chest discomfort that seems to be relieved by intermittent use of omeprazole.  [facility]  [facility]  Decreased Interest 0  Down, Depressed, Hopeless 0  PHQ - 2 Score 0  Altered sleeping 0  Tired, decreased energy 0  Change in appetite 0  Feeling bad or failure about yourself  0  Trouble concentrating 0  Moving slowly or fidgety/restless 0  Suicidal thoughts 0  PHQ-9 Score 0   No flowsheet data found.    No flowsheet data found.   There is no immunization history on file for this patient.  No exam data present  Past Medical History:  Diagnosis Date  . Frequent headaches   . GERD (gastroesophageal reflux disease)    No Known Allergies Past Surgical History:  Procedure Laterality Date  . JOINT REPLACEMENT     History reviewed. No pertinent family history. Social History   Socioeconomic History  . Marital status: Married    Spouse name: Not on file  . Number of children: Not on file  . Years of education: Not on file  . Highest education level: Not on file  Occupational History  . Not on file  Social Needs  . Financial resource strain: Not on file  . Food insecurity:    Worry: Not on file    Inability: Not on file  . Transportation needs:    Medical: Not on file    Non-medical: Not on file  Tobacco Use  . Smoking status: Current Every Day Smoker    Packs/day: 0.50    Types: E-cigarettes  .  Smokeless tobacco: Never Used  Substance and Sexual Activity  . Alcohol use: No  . Drug use: No  . Sexual activity: Never    Partners: Female  Lifestyle  . Physical activity:    Days per week: Not on file    Minutes per session: Not on file  . Stress: Not on file  Relationships  . Social connections:    Talks on phone: Not on file    Gets together: Not on file    Attends religious service: Not on file    Active member of club or organization: Not on file    Attends meetings of clubs or organizations: Not on file    Relationship status: Not on file  . Intimate partner violence:    Fear of current or ex partner: Not on file    Emotionally  abused: Not on file    Physically abused: Not on file    Forced sexual activity: Not on file  Other Topics Concern  . Not on file  Social History Narrative   Marital status/children/pets: Married   Education/employment: college educated, employed, Arts development officer:      -smoke alarm in the home:Yes     - wears seatbelt: Yes     - Feels safe in their relationships: Yes   Allergies as of 04/13/2018   No Known Allergies     Medication List        Accurate as of 04/13/18  2:43 PM. Always use your most recent med list.          cyclobenzaprine 10 MG tablet Commonly known as:  FLEXERIL Take 1 tablet (10 mg total) by mouth at bedtime.   omeprazole 20 MG capsule Commonly known as:  PRILOSEC Take 20 mg by mouth as needed.       All past medical history, surgical history, allergies, family history, immunizations andmedications were updated in the EMR today and reviewed under the history and medication portions of their EMR.    No results found for this or any previous visit (from the past 2160 hour(s)).   ROS: 14 pt review of systems performed and negative (unless mentioned in an HPI)  Objective: BP 125/76   Pulse 74   Temp 98.5 F (36.9 C) (Oral)   Resp 16   Ht 5' 9.5" (1.765 m)   Wt 184 lb (83.5 kg)   SpO2 96%   BMI 26.78 kg/m  Gen: Afebrile. No acute distress. Nontoxic in appearance, well-developed, well-nourished, mildly overweight, pleasant Caucasian male. HENT: AT. [place]. Bilateral TM visualized and normal in appearance, normal external auditory canal. MMM, no oral lesions, adequate dentition. Bilateral nares within normal limits. Throat without erythema, ulcerations or exudates.  No cough on exam, no hoarseness on exam. Eyes:Pupils Equal Round Reactive to light, Extraocular movements intact,  Conjunctiva without redness, discharge or icterus. Neck/lymp/endocrine: Supple, no lymphadenopathy CV: RRR no murmur, no edema, +2/4 P posterior tibialis pulses.  No  carotid bruits. No JVD. Chest: CTAB, no wheeze, rhonchi or crackles.  Skin: Warm and well-perfused. Skin intact. Neuro/Msk:  Normal gait. PERLA. EOMi. Alert. Oriented x3.  No TMJ tenderness to palpation. Psych: Mild motor agitation.  Standing/pacing, otherwise normal affect, dress and demeanor. Normal speech. Normal thought content and judgment.  Assessment/plan: Grant Hall is a 42 y.o. male present for EST care Chronic nonintractable headache, unspecified headache type/. Bruxism, sleep-related -Rule out temporal arteritis with sedimentation rate and location of discomfort over right temple.  Favor bruxism as  more probable diagnosis.  Trial of Flexeril nightly and encouraged him to have dental impressions for mouthguard to use at night. - Sedimentation rate - B12 - Vitamin D (25 hydroxy)  Gastroesophageal reflux disease, esophagitis presence not specified/Overweight (BMI 25.0-29.9) - PPI use- omeprazole OTC - B12 - Vitamin D (25 hydroxy)  Current tobacco use/non-cardiac chest discomfort - vaping tobacco last 2 years.  - Strongly encouraged him to quit. - Tobacco and vape may be laying a role in headaches and int. Non-cardiac Chest discomfort which is not present today.  - reviewed cxr 2017 when symptoms started- NL, Normal cardiac work up With Dr. Jacinto Halim reported- including stress test. - currently using omeprazole PRN OTC- and he feels it helps when he takes it.    Return in about 4 weeks (around 05/11/2018) for headaches, CPE.   Note is dictated utilizing voice recognition software. Although note has been proof read prior to signing, occasional typographical errors still can be missed. If any questions arise, please do not hesitate to call for verification.  Electronically signed by: Felix Pacini, DO  Primary Care- [place]

## 2018-05-11 ENCOUNTER — Ambulatory Visit (INDEPENDENT_AMBULATORY_CARE_PROVIDER_SITE_OTHER): Payer: No Typology Code available for payment source | Admitting: Family Medicine

## 2018-05-11 ENCOUNTER — Encounter: Payer: Self-pay | Admitting: Family Medicine

## 2018-05-11 VITALS — BP 122/78 | HR 81 | Temp 98.4°F | Resp 16 | Ht 69.5 in | Wt 188.0 lb

## 2018-05-11 DIAGNOSIS — R519 Headache, unspecified: Secondary | ICD-10-CM

## 2018-05-11 DIAGNOSIS — Z23 Encounter for immunization: Secondary | ICD-10-CM

## 2018-05-11 DIAGNOSIS — G4763 Sleep related bruxism: Secondary | ICD-10-CM

## 2018-05-11 DIAGNOSIS — Z131 Encounter for screening for diabetes mellitus: Secondary | ICD-10-CM

## 2018-05-11 DIAGNOSIS — E538 Deficiency of other specified B group vitamins: Secondary | ICD-10-CM

## 2018-05-11 DIAGNOSIS — R51 Headache: Secondary | ICD-10-CM | POA: Diagnosis not present

## 2018-05-11 DIAGNOSIS — Z0000 Encounter for general adult medical examination without abnormal findings: Secondary | ICD-10-CM | POA: Diagnosis not present

## 2018-05-11 DIAGNOSIS — Z13220 Encounter for screening for lipoid disorders: Secondary | ICD-10-CM

## 2018-05-11 DIAGNOSIS — E663 Overweight: Secondary | ICD-10-CM

## 2018-05-11 DIAGNOSIS — Z114 Encounter for screening for human immunodeficiency virus [HIV]: Secondary | ICD-10-CM

## 2018-05-11 LAB — LIPID PANEL
CHOLESTEROL: 226 mg/dL — AB (ref 0–200)
HDL: 40.5 mg/dL (ref >39.00)
LDL CALC: 159 mg/dL — AB (ref 0–99)
NonHDL: 185.62
TRIGLYCERIDES: 134 mg/dL (ref 0.0–149.0)
Total CHOL/HDL Ratio: 6
VLDL: 26.8 mg/dL (ref 0.0–40.0)

## 2018-05-11 LAB — CBC WITH DIFFERENTIAL/PLATELET
BASOS PCT: 1 % (ref 0.0–3.0)
Basophils Absolute: 0.1 10*3/uL (ref 0.0–0.1)
EOS ABS: 0.2 10*3/uL (ref 0.0–0.7)
EOS PCT: 4.1 % (ref 0.0–5.0)
HCT: 45.7 % (ref 39.0–52.0)
HEMOGLOBIN: 15.8 g/dL (ref 13.0–17.0)
Lymphocytes Relative: 32.4 % (ref 12.0–46.0)
Lymphs Abs: 1.8 10*3/uL (ref 0.7–4.0)
MCHC: 34.7 g/dL (ref 30.0–36.0)
MCV: 88.8 fl (ref 78.0–100.0)
MONO ABS: 0.5 10*3/uL (ref 0.1–1.0)
Monocytes Relative: 8.6 % (ref 3.0–12.0)
Neutro Abs: 3 10*3/uL (ref 1.4–7.7)
Neutrophils Relative %: 53.9 % (ref 43.0–77.0)
PLATELETS: 271 10*3/uL (ref 150.0–400.0)
RBC: 5.14 Mil/uL (ref 4.22–5.81)
RDW: 13.2 % (ref 11.5–15.5)
WBC: 5.6 10*3/uL (ref 4.0–10.5)

## 2018-05-11 LAB — COMPREHENSIVE METABOLIC PANEL
ALT: 75 U/L — AB (ref 0–53)
AST: 25 U/L (ref 0–37)
Albumin: 4.6 g/dL (ref 3.5–5.2)
Alkaline Phosphatase: 64 U/L (ref 39–117)
BUN: 18 mg/dL (ref 6–23)
CALCIUM: 9.8 mg/dL (ref 8.4–10.5)
CHLORIDE: 102 meq/L (ref 96–112)
CO2: 30 meq/L (ref 19–32)
CREATININE: 1.01 mg/dL (ref 0.40–1.50)
GFR: 86.85 mL/min (ref >60.00)
GLUCOSE: 102 mg/dL — AB (ref 70–99)
Potassium: 4.7 mEq/L (ref 3.5–5.1)
Sodium: 139 mEq/L (ref 135–145)
Total Bilirubin: 0.5 mg/dL (ref 0.2–1.2)
Total Protein: 7.1 g/dL (ref 6.0–8.3)

## 2018-05-11 LAB — MAGNESIUM: Magnesium: 2 mg/dL (ref 1.5–2.5)

## 2018-05-11 LAB — HEMOGLOBIN A1C: HEMOGLOBIN A1C: 5.6 % (ref 4.6–6.5)

## 2018-05-11 LAB — TSH: TSH: 1.39 u[IU]/mL (ref 0.35–4.50)

## 2018-05-11 MED ORDER — CYANOCOBALAMIN 1000 MCG/ML IJ SOLN
1000.0000 ug | Freq: Once | INTRAMUSCULAR | Status: AC
  Administered 2018-05-11: 1000 ug via INTRAMUSCULAR

## 2018-05-11 NOTE — Patient Instructions (Signed)
B12 injection today and repeat in 2 weeks, then once monthly. If all labs normal and B12 injections do not improve headaches in 6-8 weeks--> would recommend either neurology referral or medication start--> follow up at that time.       Health Maintenance, Male A healthy lifestyle and preventive care is important for your health and wellness. Ask your health care provider about what schedule of regular examinations is right for you. What should I know about weight and diet? Eat a Healthy Diet  Eat plenty of vegetables, fruits, whole grains, low-fat dairy products, and lean protein.  Do not eat a lot of foods high in solid fats, added sugars, or salt.  Maintain a Healthy Weight Regular exercise can help you achieve or maintain a healthy weight. You should:  Do at least 150 minutes of exercise each week. The exercise should increase your heart rate and make you sweat (moderate-intensity exercise).  Do strength-training exercises at least twice a week. Watch Your Levels of Cholesterol and Blood Lipids  Have your blood tested for lipids and cholesterol every 5 years starting at 41 years of age. If you are at high risk for heart disease, you should start having your blood tested when you are 41 years old. You may need to have your cholesterol levels checked more often if: ? Your lipid or cholesterol levels are high. ? You are older than 41 years of age. ? You are at high risk for heart disease. What should I know about cancer screening? Many types of cancers can be detected early and may often be prevented. Lung Cancer  You should be screened every year for lung cancer if: ? You are a current smoker who has smoked for at least 30 years. ? You are a former smoker who has quit within the past 15 years.  Talk to your health care provider about your screening options, when you should start screening, and how often you should be screened. Colorectal Cancer  Routine colorectal cancer  screening usually begins at 41 years of age and should be repeated every 5-10 years until you are 41 years old. You may need to be screened more often if early forms of precancerous polyps or small growths are found. Your health care provider may recommend screening at an earlier age if you have risk factors for colon cancer.  Your health care provider may recommend using home test kits to check for hidden blood in the stool.  A small camera at the end of a tube can be used to examine your colon (sigmoidoscopy or colonoscopy). This checks for the earliest forms of colorectal cancer. Prostate and Testicular Cancer  Depending on your age and overall health, your health care provider may do certain tests to screen for prostate and testicular cancer.  Talk to your health care provider about any symptoms or concerns you have about testicular or prostate cancer. Skin Cancer  Check your skin from head to toe regularly.  Tell your health care provider about any new moles or changes in moles, especially if: ? There is a change in a mole's size, shape, or color. ? You have a mole that is larger than a pencil eraser.  Always use sunscreen. Apply sunscreen liberally and repeat throughout the day.  Protect yourself by wearing long sleeves, pants, a wide-brimmed hat, and sunglasses when outside. What should I know about heart disease, diabetes, and high blood pressure?  If you are 45-73 years of age, have your blood pressure checked  every 3-5 years. If you are 55 years of age or older, have your blood pressure checked every year. You should have your blood pressure measured twice-once when you are at a hospital or clinic, and once when you are not at a hospital or clinic. Record the average of the two measurements. To check your blood pressure when you are not at a hospital or clinic, you can use: ? An automated blood pressure machine at a pharmacy. ? A home blood pressure monitor.  Talk to your health  care provider about your target blood pressure.  If you are between 48-20 years old, ask your health care provider if you should take aspirin to prevent heart disease.  Have regular diabetes screenings by checking your fasting blood sugar level. ? If you are at a normal weight and have a low risk for diabetes, have this test once every three years after the age of 8. ? If you are overweight and have a high risk for diabetes, consider being tested at a younger age or more often.  A one-time screening for abdominal aortic aneurysm (AAA) by ultrasound is recommended for men aged 94-75 years who are current or former smokers. What should I know about preventing infection? Hepatitis B If you have a higher risk for hepatitis B, you should be screened for this virus. Talk with your health care provider to find out if you are at risk for hepatitis B infection. Hepatitis C Blood testing is recommended for:  Everyone born from [birth year] through 1963.  Anyone with known risk factors for hepatitis C. Sexually Transmitted Diseases (STDs)  You should be screened each year for STDs including gonorrhea and chlamydia if: ? You are sexually active and are younger than 41 years of age. ? You are older than 41 years of age and your health care provider tells you that you are at risk for this type of infection. ? Your sexual activity has changed since you were last screened and you are at an increased risk for chlamydia or gonorrhea. Ask your health care provider if you are at risk.  Talk with your health care provider about whether you are at high risk of being infected with HIV. Your health care provider may recommend a prescription medicine to help prevent HIV infection. What else can I do?  Schedule regular health, dental, and eye exams.  Stay current with your vaccines (immunizations).  Do not use any tobacco products, such as cigarettes, chewing tobacco, and e-cigarettes. If you need help quitting, ask  your health care provider.  Limit alcohol intake to no more than 2 drinks per day. One drink equals 12 ounces of beer, 5 ounces of wine, or 1 ounces of hard liquor.  Do not use street drugs.  Do not share needles.  Ask your health care provider for help if you need support or information about quitting drugs.  Tell your health care provider if you often feel depressed.  Tell your health care provider if you have ever been abused or do not feel safe at home. This information is not intended to replace advice given to you by your health care provider. Make sure you discuss any questions you have with your health care provider. Document Released: 10/23/2007 Document Revised: 12/24/2015 Document Reviewed: 01/28/2015 Elsevier Interactive Patient Education  2019 [employer].

## 2018-05-11 NOTE — Progress Notes (Signed)
cbccb

## 2018-05-11 NOTE — Progress Notes (Signed)
Patient ID: Grant Hall, male  DOB: 1976/01/15, 43 y.o.   MRN: [id number] Patient Care Team    Relationship Specialty Notifications Start End  Ma Hillock, DO PCP - General Family Medicine  04/13/18   Adrian Prows, MD Consulting Physician Cardiology  04/13/18   Specialists, [facility]  Orthopedic Surgery  04/13/18     Chief Complaint  Patient presents with  . Annual Exam    Pt is fasting  . Follow-up    Headaches, has been still getting the headaches, not as often, recently had a headache in a different spot-more located in the back of the head but still on the Rt side    Subjective:  Grant Hall is a 43 y.o. male present for CPE. All past medical history, surgical history, allergies, family history, immunizations, medications and social history were updated in the electronic medical record today. All recent labs, ED visits and hospitalizations within the last year were reviewed.  Headaches/bruxism/B12 deficiency:  Reports his headaches not improved much, maybe a little bit after trying Flexeril.  Low B12 and normal sed rate along with normal vitamin D collected last visit.  He again states that his headaches have been chronic for over a decade.  Health maintenance:  Colonoscopy: no fhx, routine screen.  Immunizations:  tdap updated today, influenza declined today Infectious disease screening: HIV completed today Assistive device: none Oxygen use: none Patient has a Dental home. Hospitalizations/ED visits: reviewed  Depression screen [facility] 2/9 04/13/2018  Decreased Interest 0  Down, Depressed, Hopeless 0  PHQ - 2 Score 0  Altered sleeping 0  Tired, decreased energy 0  Change in appetite 0  Feeling bad or failure about yourself  0  Trouble concentrating 0  Moving slowly or fidgety/restless 0  Suicidal thoughts 0  PHQ-9 Score 0   No flowsheet data found.    No flowsheet data found.  Immunization History  Administered Date(s) Administered  . Tdap  05/11/2018    Past Medical History:  Diagnosis Date  . Frequent headaches   . GERD (gastroesophageal reflux disease)    No Known Allergies Past Surgical History:  Procedure Laterality Date  . JOINT REPLACEMENT     Family History  Problem Relation Age of Onset  . Melanoma Mother   . Diabetes Father   . Heart attack Father    Social History   Socioeconomic History  . Marital status: Married    Spouse name: Not on file  . Number of children: Not on file  . Years of education: Not on file  . Highest education level: Not on file  Occupational History  . Not on file  Social Needs  . Financial resource strain: Not on file  . Food insecurity:    Worry: Not on file    Inability: Not on file  . Transportation needs:    Medical: Not on file    Non-medical: Not on file  Tobacco Use  . Smoking status: Current Every Day Smoker    Packs/day: 0.50    Types: E-cigarettes  . Smokeless tobacco: Never Used  Substance and Sexual Activity  . Alcohol use: No  . Drug use: No  . Sexual activity: Never    Partners: Female  Lifestyle  . Physical activity:    Days per week: Not on file    Minutes per session: Not on file  . Stress: Not on file  Relationships  . Social connections:    Talks on phone:  Not on file    Gets together: Not on file    Attends religious service: Not on file    Active member of club or organization: Not on file    Attends meetings of clubs or organizations: Not on file    Relationship status: Not on file  . Intimate partner violence:    Fear of current or ex partner: Not on file    Emotionally abused: Not on file    Physically abused: Not on file    Forced sexual activity: Not on file  Other Topics Concern  . Not on file  Social History Narrative   Marital status/children/pets: Married   Education/employment: college educated, employed, Theme park manager:      -smoke alarm in the home:Yes     - wears seatbelt: Yes     - Feels safe in their  relationships: Yes   Allergies as of 05/11/2018   No Known Allergies     Medication List       Accurate as of May 11, 2018  3:14 PM. Always use your most recent med list.        omeprazole 20 MG capsule Commonly known as:  PRILOSEC Take 20 mg by mouth as needed.      All past medical history, surgical history, allergies, family history, immunizations andmedications were updated in the EMR today and reviewed under the history and medication portions of their EMR.     Recent Results (from the past 2160 hour(s))  Sedimentation rate     Status: None   Collection Time: 04/13/18 10:32 AM  Result Value Ref Range   Sed Rate 8 0 - 15 mm/hr  B12     Status: None   Collection Time: 04/13/18 10:32 AM  Result Value Ref Range   Vitamin B-12 333 211 - 911 pg/mL  Vitamin D (25 hydroxy)     Status: None   Collection Time: 04/13/18 10:32 AM  Result Value Ref Range   VITD 32.83 30.00 - 100.00 ng/mL   ROS: 14 pt review of systems performed and negative (unless mentioned in an HPI)  Objective: BP 122/78 (BP Location: Right Arm, Patient Position: Sitting, Cuff Size: Large)   Pulse 81   Temp 98.4 F (36.9 C) (Oral)   Resp 16   Ht 5' 9.5" (1.765 m)   Wt 188 lb (85.3 kg)   SpO2 97%   BMI 27.36 kg/m  Gen: Afebrile. No acute distress. Nontoxic in appearance, well-developed, well-nourished, pleasant, Caucasian male.  Mildly overweight HENT: AT. [place]. Bilateral TM visualized and normal in appearance, normal external auditory canal. MMM, no oral lesions, adequate dentition. Bilateral nares within normal limits. Throat without erythema, ulcerations or exudates.  No cough on exam, no hoarseness on exam. Eyes:Pupils Equal Round Reactive to light, Extraocular movements intact,  Conjunctiva without redness, discharge or icterus. Neck/lymp/endocrine: Supple, no lymphadenopathy, no thyromegaly CV: RRR no murmur, no edema, +2/4 P posterior tibialis pulses.  No carotid bruits. No JVD. Chest: CTAB, no  wheeze, rhonchi or crackles.  Normal respiratory effort.  Good air movement. Abd: Soft.  Flat. NTND. BS present.  No masses palpated. No hepatosplenomegaly. No rebound tenderness or guarding. Skin: No rashes, purpura or petechiae. Warm and well-perfused. Skin intact. Neuro/Msk:  Normal gait. PERLA. EOMi. Alert. Oriented x3.  Cranial nerves II through XII intact. Muscle strength 5/5 upper/lower extremity. DTRs equal bilaterally. Psych: Normal affect, dress and demeanor. Normal speech. Normal thought content and judgment.  No exam data  present  Assessment/plan: Grant Hall is a 43 y.o. male present for CPE Chronic nonintractable headache, unspecified headache type/Bruxism, sleep-related/B12 deficiency -Headaches present greater than a decade.  Overall did not seem to make a difference with his teeth grinding.  Advised him to follow-up with his dentist for mouthguard and bite evaluation as potential cause of his head discomfort.  B12 was low, start B12 injections. -B12 order : B12 injection completed today, repeat in 2 weeks, then monthly for 4 doses. - cyanocobalamin ((VITAMIN B-12)) injection 1,000 mcg - CBC w/Diff - TSH - Magnesium - Comp Met (CMET) -If all labs normal, and B12 injections  are not helping with his headaches after 6  weeks.  Can consider a daily anti-inflammatory versus Topamax versus neurology referral.  He seems to want to try medication versus referral initially, but is wanting to wait to see if B12 injections work for him. Diabetes mellitus screening - HgB A1c Screening cholesterol level - Lipid Profile Encounter for screening for HIV Pt agreeable to screening today - HIV antibody (with reflex) Overweight (BMI 25.0-29.9) Diet and exercise Immunization due - Tdap vaccine greater than or equal to 7yo IM Encounter for preventive health examination Patient was encouraged to exercise greater than 150 minutes a week. Patient was encouraged to choose a diet filled with  fresh fruits and vegetables, and lean meats. AVS provided to patient today for education/recommendation on gender specific health and safety maintenance.  Return in about 8 weeks (around 07/06/2018) for headaches. 1 year follow-up for CPE  Note is dictated utilizing voice recognition software. Although note has been proof read prior to signing, occasional typographical errors still can be missed. If any questions arise, please do not hesitate to call for verification.  Electronically signed by: Howard Pouch, DO [place]

## 2018-05-12 ENCOUNTER — Telehealth: Payer: Self-pay | Admitting: Family Medicine

## 2018-05-12 DIAGNOSIS — R7989 Other specified abnormal findings of blood chemistry: Secondary | ICD-10-CM | POA: Insufficient documentation

## 2018-05-12 DIAGNOSIS — R945 Abnormal results of liver function studies: Principal | ICD-10-CM

## 2018-05-12 LAB — HIV ANTIBODY (ROUTINE TESTING W REFLEX): HIV: NONREACTIVE

## 2018-05-12 NOTE — Telephone Encounter (Signed)
Advised patient lab results and recommendations from PCP. He is agreeable. Lab appointment scheduled in 4 weeks. Encouraged to work harder on diet and exercise. Patient is currently getting over a viral illness. He denies any Tylenol or Alcohol.

## 2018-05-12 NOTE — Telephone Encounter (Signed)
Please inform patient the following information: His lab were all stable/normal with the exception of - a  liver enzyme called ALT was mildly elevated this can be elevated from certain liver conditions, meds,  high cholesterol/fatty liver or some viral illnesses. Recommend we repeat the lab in 2 -4 weeks to confirm it is remaining elevated and then proceed with potential work only if it remains elevated on retest. Avoid tylenol and alcohol is he uses either until retest. He can schedule this for the same day he is already coming in for nurse visit b12 injection if he likes.  (lab appt only- orders placed) -  his cholesterol was higher than desired with a LDL, which is the bad cholesterol at 159. LDL goal for him would be < 130. No medications needed at this time, recommend routine daily exercise and diet low in saturated fats and higher in fiber with help keep his cholesterol in normal levels. We will continue to monitor levels yearly at physicals.

## 2018-06-08 ENCOUNTER — Other Ambulatory Visit: Payer: No Typology Code available for payment source

## 2018-07-06 ENCOUNTER — Encounter: Payer: Self-pay | Admitting: Family Medicine

## 2018-07-06 ENCOUNTER — Ambulatory Visit: Payer: No Typology Code available for payment source | Admitting: Family Medicine

## 2018-07-06 VITALS — BP 117/70 | HR 69 | Temp 98.0°F | Resp 14 | Ht 70.0 in | Wt 186.0 lb

## 2018-07-06 DIAGNOSIS — R945 Abnormal results of liver function studies: Secondary | ICD-10-CM | POA: Diagnosis not present

## 2018-07-06 DIAGNOSIS — G4763 Sleep related bruxism: Secondary | ICD-10-CM

## 2018-07-06 DIAGNOSIS — R51 Headache: Secondary | ICD-10-CM

## 2018-07-06 DIAGNOSIS — G8929 Other chronic pain: Secondary | ICD-10-CM

## 2018-07-06 DIAGNOSIS — R7989 Other specified abnormal findings of blood chemistry: Secondary | ICD-10-CM

## 2018-07-06 DIAGNOSIS — E538 Deficiency of other specified B group vitamins: Secondary | ICD-10-CM | POA: Diagnosis not present

## 2018-07-06 LAB — COMPREHENSIVE METABOLIC PANEL
ALBUMIN: 4.5 g/dL (ref 3.5–5.2)
ALK PHOS: 58 U/L (ref 39–117)
ALT: 48 U/L (ref 0–53)
AST: 32 U/L (ref 0–37)
BILIRUBIN TOTAL: 0.6 mg/dL (ref 0.2–1.2)
BUN: 20 mg/dL (ref 6–23)
CO2: 27 meq/L (ref 19–32)
Calcium: 9.5 mg/dL (ref 8.4–10.5)
Chloride: 105 mEq/L (ref 96–112)
Creatinine, Ser: 1.09 mg/dL (ref 0.40–1.50)
GFR: 74.78 mL/min (ref >60.00)
GLUCOSE: 95 mg/dL (ref 70–99)
Potassium: 4.6 mEq/L (ref 3.5–5.1)
Sodium: 139 mEq/L (ref 135–145)
TOTAL PROTEIN: 6.9 g/dL (ref 6.0–8.3)

## 2018-07-06 NOTE — Progress Notes (Signed)
Grant Hall , [birth year], 43 y.o., male MRN: [id number] Patient Care Team    Relationship Specialty Notifications Start End  Ma Hillock, DO PCP - General Family Medicine  04/13/18   Adrian Prows, MD Consulting Physician Cardiology  04/13/18   Specialists, [facility]  Orthopedic Surgery  04/13/18     Chief Complaint  Patient presents with  . Follow-up    headaches, pt is fasting     Subjective:  Headaches/bruxism/B12 deficiency:  Patient has not started the B12 daily 500-1021mg routinely as directed.  He elected not to have the B12 injections at this time.  He is present for follow-up today.  Headaches are still present and unchanged.  He did note when having his viral illnesses and taking NSAIDs his headache was resolved. Prior note: Reports his headaches not improved much, maybe a little bit after trying Flexeril.  Low B12 and normal sed rate along with normal vitamin D collected last visit.  He again states that his headaches have been chronic for over a decade.  Elevated LFT: Patient has not had his repeat liver enzymes collected as of yet.  He does endorse having a viral illness at the time of the prior collection.  Of note he also just recently got over a viral illness 2 weeks ago.  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 Frequent headaches   . GERD  (gastroesophageal reflux disease)    Past Surgical History:  Procedure Laterality Date  . JOINT REPLACEMENT     Family History  Problem Relation Age of Onset  . Melanoma Mother   . Diabetes Father   . Heart attack Father    Allergies as of 07/06/2018   No Known Allergies     Medication List       Accurate as of July 06, 2018  3:18 PM. Always use your most recent med list.        omeprazole 20 MG capsule Commonly known as:  PRILOSEC Take 20 mg by mouth as needed.       All past medical history, surgical history, allergies, family history, immunizations andmedications were updated in the EMR today and reviewed under the history and medication portions of their EMR.     ROS: Negative, with the exception of above mentioned in HPI   Objective:  BP 117/70 (BP Location: Left Arm, Patient Position: Sitting, Cuff Size: Normal)   Pulse 69   Temp 98 F (36.7 C) (Oral)   Resp 14   Ht _0  (1.778 m)   Wt 186 lb (84.4 kg)   SpO2 96%   BMI 26.69 kg/m  Body mass index is 26.69 kg/m. Gen: Afebrile. No acute distress. Nontoxic in appearance, well developed, well nourished.  HENT: AT. [place].MMM Eyes:Pupils Equal Round  Reactive to light, Extraocular movements intact,  Conjunctiva without redness, discharge or icterus. CV: RRR Chest: CTAB, no wheeze or crackles. Good air movement, normal resp effort.  Abd: Soft.  NTND. BS present. no Masses palpated. No rebound or guarding.  Skin: no rashes, purpura or petechiae.  Neuro: Normal gait. PERLA. EOMi. Alert. Oriented x3 Psych: Normal affect, dress and demeanor. Normal speech. Normal thought content and judgment.  No exam data present No results found. No results found for this or any previous visit (from the past 24 hour(s)).  Assessment/Plan: LEONE PUTMAN is a 43 y.o. male present for OV for  Chronic nonintractable headache, unspecified headache type/Bruxism, sleep-related/B12 deficiency - Headaches present greater than a  decade.  Suspect possible musculoskeletal component for his headaches.   - Overall did not seem to make a difference with his teeth grinding or Flexeril all use.  Advised him to follow-up with his dentist for mouthguard and bite evaluation as potential cause of his head discomfort.  -  B12 was low/normal, elected not to do B12 injections and he has not routinely started the B12 oral supplement. -He would rather not have a neurological referral unless needed, declined today. -Discussed options with him at still recommend he take the B12 daily.  Will recommend he be seen by his dentist for his bite. -We can try NSAIDs, which we are not ideal for headaches may cause rebound headache, however if muscle skeletal in nature such as with TMJ this may prove to be helpful. -Pending upon lab results consider low-dose meloxicam  Elevated LFT: Repeat liver enzymes today.  Depending upon results will consider NSAID prescription for the above.   Reviewed expectations re: course of current medical issues.  Discussed self-management of symptoms.  Outlined signs and symptoms indicating need for more acute intervention.  Patient verbalized understanding and all questions were answered.  Patient received an After-Visit Summary.    Orders Placed This Encounter  Procedures  . Comp Met (CMET)     Note is dictated utilizing voice recognition software. Although note has been proof read prior to signing, occasional typographical errors still can be missed. If any questions arise, please do not hesitate to call for verification.   electronically signed by:  Howard Pouch, DO  [place]

## 2018-07-06 NOTE — Patient Instructions (Signed)
Continue b12 daily- if need be can do injections if you desire.  We will recheck liver enzymes today to make sure liver enzyme returned to normal.  We will prescribe a daily anti-inflammatory such as mobic or voltaren depending upon test today.

## 2018-07-07 ENCOUNTER — Telehealth: Payer: Self-pay | Admitting: Family Medicine

## 2018-07-07 MED ORDER — DICLOFENAC SODIUM ER 100 MG PO TB24: 100.0000 mg | ORAL_TABLET | Freq: Every day | ORAL | 3 refills | Status: DC

## 2018-07-07 NOTE — Telephone Encounter (Signed)
Pt was called and given lab results and information regarding new medication. Pt verbalized understanding.

## 2018-07-07 NOTE — Telephone Encounter (Signed)
Please inform patient the following information: His liver function has returned to normal. No further work up needed, was likely caused from a viral illness.  I have called in Voltaren to try for his headaches. This is a once daily med only.  - he should avoid additional routine use of advil/naproxen/motrin when using this product- occasional use of those products is fine--> just hold diclofenac/voltaran  that day.

## 2019-08-27 ENCOUNTER — Telehealth: Payer: Self-pay

## 2019-08-27 NOTE — Telephone Encounter (Signed)
Patent called in needing a refill on his medication    Diclofenac Sodium CR 100 MG 24 hr tablet   [id number] - [place], [place] - [address]   Please advise

## 2019-08-27 NOTE — Telephone Encounter (Signed)
Pt was called and told he would need to make appt as he has not been seen since 06/2018. Appt was scheduled for patient.

## 2019-08-30 ENCOUNTER — Encounter: Payer: Self-pay | Admitting: Family Medicine

## 2019-08-30 ENCOUNTER — Other Ambulatory Visit: Payer: Self-pay

## 2019-08-30 ENCOUNTER — Ambulatory Visit: Payer: No Typology Code available for payment source | Admitting: Family Medicine

## 2019-08-30 VITALS — BP 122/81 | HR 71 | Temp 98.2°F | Resp 17 | Ht 70.0 in | Wt 190.4 lb

## 2019-08-30 DIAGNOSIS — Z79899 Other long term (current) drug therapy: Secondary | ICD-10-CM | POA: Diagnosis not present

## 2019-08-30 DIAGNOSIS — K219 Gastro-esophageal reflux disease without esophagitis: Secondary | ICD-10-CM

## 2019-08-30 DIAGNOSIS — G4763 Sleep related bruxism: Secondary | ICD-10-CM

## 2019-08-30 DIAGNOSIS — G8929 Other chronic pain: Secondary | ICD-10-CM

## 2019-08-30 DIAGNOSIS — R519 Headache, unspecified: Secondary | ICD-10-CM | POA: Diagnosis not present

## 2019-08-30 MED ORDER — OMEPRAZOLE 20 MG PO CPDR: 20.0000 mg | DELAYED_RELEASE_CAPSULE | ORAL | 11 refills | Status: DC | PRN

## 2019-08-30 MED ORDER — DICLOFENAC SODIUM ER 100 MG PO TB24: 100.0000 mg | ORAL_TABLET | Freq: Every day | ORAL | 3 refills | Status: DC

## 2019-08-30 NOTE — Progress Notes (Signed)
Grant Hall , 09-08-1975, 44 y.o., male MRN: [id number] Patient Care Team    Relationship Specialty Notifications Start End  Ma Hillock, DO PCP - General Family Medicine  04/13/18   Adrian Prows, MD Consulting Physician Cardiology  04/13/18   Specialists, [facility]  Orthopedic Surgery  04/13/18     Chief Complaint  Patient presents with  . Gastroesophageal Reflux    Fasting.   . Pain     Subjective:Grant Hall is a 44 y.o. male present for [facility] follow up.  Headaches/bruxism Pt reports his chronic headaches that have been present > 10 years were greatly controled with daily Voltaren. He has been out of the medication for a few days and his headaches returned. He does endorse they are not as bad as they use to be when they do occur. He did not get any benefit from the flexeril. He has not started b12 or using mouth guard.   Prior note: Reports his headaches not improved much, maybe a little bit after trying Flexeril.  Low B12 and normal sed rate along with normal vitamin D collected last visit.  He again states that his headaches have been chronic for over a decade.  GERD:  Using OTC prilosec prn. Works well when needed.   Depression screen PHQ 2/9 04/13/2018  Decreased Interest 0  Down, Depressed, Hopeless 0  PHQ - 2 Score 0  Altered sleeping 0  Tired, decreased energy 0  Change in appetite 0  Feeling bad or failure about yourself  0  Trouble concentrating 0  Moving slowly or fidgety/restless 0  Suicidal thoughts 0  PHQ-9 Score 0    No Known Allergies Social History   Social History Narrative   Marital status/children/pets: Married   Education/employment: college educated, employed, Theme park manager:      -smoke alarm in the home:Yes     - wears seatbelt: Yes     - Feels safe in their relationships: Yes   Past Medical History:  Diagnosis Date  . Frequent headaches   . GERD (gastroesophageal reflux disease)    Past Surgical History:    Procedure Laterality Date  . JOINT REPLACEMENT     Family History  Problem Relation Age of Onset  . Melanoma Mother   . Diabetes Father   . Heart attack Father    Allergies as of 08/30/2019   No Known Allergies     Medication List       Accurate as of August 30, 2019  9:07 AM. If you have any questions, ask your nurse or doctor.        Diclofenac Sodium CR 100 MG 24 hr tablet Take 1 tablet (100 mg total) by mouth daily.   omeprazole 20 MG capsule Commonly known as: PRILOSEC Take 20 mg by mouth as needed.       All past medical history, surgical history, allergies, family history, immunizations andmedications were updated in the EMR today and reviewed under the history and medication portions of their EMR.     ROS: Negative, with the exception of above mentioned in HPI   Objective:  BP 122/81 (BP Location: Left Arm, Patient Position: Sitting, Cuff Size: Normal)   Pulse 71   Temp 98.2 F (36.8 C) (Temporal)   Resp 17   Ht 5\' 10"  (1.778 m)   Wt 190 lb 6 oz (86.4 kg)   SpO2 97%   BMI 27.32 kg/m  Body mass index is 27.[place]  kg/m. Gen: Afebrile. No acute distress.  HENT: AT. [place].  Eyes:Pupils Equal Round Reactive to light, Extraocular movements intact,  Conjunctiva without redness, discharge or icterus. CV: RRR, no edema Chest: CTAB, no wheeze or crackles Neuro: Normal gait. PERLA. EOMi. Alert. Oriented x3 .  No exam data present No results found. No results found for this or any previous visit (from the past 24 hour(s)).  Assessment/Plan: Grant Hall is a 44 y.o. 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  - tried flexeril without relief.  Continue diclofenac - encouraged mouth guard use.  -  B12 was low/normal encouraged - he was encouraged to start b12 OTC if desired.  -He would rather not have a neurological referral    Gastroesophageal reflux disease, unspecified whether esophagitis present/Current use of proton pump inhibitor - Uses prilosec as needed.  - continue Prilosec PRN    Reviewed expectations re: course of current medical issues.  Discussed self-management of symptoms.  Outlined signs and symptoms indicating need for more acute intervention.  Patient verbalized understanding and all questions were answered.  Patient received an After-Visit Summary.    No orders of the defined types were placed in this encounter.  Meds ordered this encounter  Medications  . Diclofenac Sodium CR 100 MG 24 hr tablet    Sig: Take 1 tablet (100 mg total) by mouth daily.    Dispense:  90 tablet    Refill:  3  . omeprazole (PRILOSEC) 20 MG capsule    Sig: Take 1 capsule (20 mg total) by mouth as needed.    Dispense:  30 capsule    Refill:  11    Hold until you request   Referral Orders  No referral(s) requested today      Note is dictated utilizing voice recognition software. Although note has been proof read prior to signing, occasional typographical errors still can be missed. If any questions arise, please do not hesitate to call for verification.   electronically signed by:  Felix Pacini, DO  [facility]

## 2019-08-30 NOTE — Patient Instructions (Signed)
COVID-19 Vaccine Information can be found at: [email or web address] For questions related to vaccine distribution or appointments, please email [email or web address] or call [phone].  Covid Vaccine appointment go to [email or web address].  Nice to see you today.  I have refilled your medication for you.  Follow yearly- would encourage yearly physicals in which we complete your preventive care and blood work.  Please help [place] help you:  We are honored you have chosen [facility] for your Primary Care home. Below you will find basic instructions that you may need to access in the future. Please help [place] help you by reading the instructions, which cover many of the frequent questions we experience.   Prescription refills and request:  -In order to allow more efficient response time, please call your pharmacy for all refills. They will forward the request electronically to [place]. This allows for the quickest possible response. Request left on a nurse line can take longer to refill, since these are checked as time allows between office patients and other phone calls.  - refill request can take up to 3-5 working days to complete.  - If request is sent electronically and request is appropiate, it is usually completed in 1-2 business days.  - all patients will need to be seen routinely for all chronic medical conditions requiring prescription medications (see follow-up below). If you are overdue for follow up on your condition, you will be asked to make an appointment and we will call in enough medication to cover you until your appointment (up to 30 days).  - all controlled substances will require a face to face visit to request/refill.  - if you desire your prescriptions to go through a new pharmacy, and have an active script at original pharmacy, you will need to call your pharmacy and have scripts transferred to new pharmacy. This is  completed between the pharmacy locations and not by your provider.    Results: Our office handles many outgoing and incoming calls daily. If we have not contacted you within 1 week about your results, please check your mychart to see if there is a message first and if not, then contact our office.  In helping with this matter, you help decrease call volume, and therefore allow [place] to be able to respond to patients needs more efficiently.  We will always attempt to call you with results,  normal or abnormal. However, if we are unable to reach you we will send a message in your my chart with results.   Acute office visits (sick visit):  An acute visit is intended for a new problem and are scheduled in shorter time slots to allow schedule openings for patients with new problems. This is the appropriate visit to discuss a new problem. Problems will not be addressed by phone call or Echart message. Appointment is needed if requesting treatment. In order to provide you with excellent quality medical care with proper time for you to explain your problem, have an exam and receive treatment with instructions, these appointments should be limited to one new problem per visit. If you experience a new problem, in which you desire to be addressed, please make an acute office visit, we save openings on the schedule to accommodate you. Please do not save your new problem for any other type of visit, let us take care of it properly and quickly for you.   Follow up visits:  Depending on your condition(s) your provider will need to see you routinely in order to provide  you with quality care and prescribe medication(s). Most chronic conditions (Example: hypertension, Diabetes, depression/anxiety... etc), require visits a couple times a year. Your provider will instruct you on proper follow up for your personal medical conditions and history. Please make certain to make follow up appointments for your condition as instructed.  Failing to do so could result in lapse in your medication treatment/refills. If you request a refill, and are overdue to be seen on a condition, we will always provide you with a 30 day script (once) to allow you time to schedule.    Medicare wellness (well visit): - we have a wonderful Nurse Selena Batten), that will meet with you and provide you will yearly medicare wellness visits. These visits should occur yearly (can not be scheduled less than 1 calendar year apart) and cover preventive health, immunizations, advance directives and screenings you are entitled to yearly through your medicare benefits. Do not miss out on your entitled benefits, this is when medicare will pay for these benefits to be ordered for you.  These are strongly encouraged by your provider and is the appropriate type of visit to make certain you are up to date with all preventive health benefits. If you have not had your medicare wellness exam in the last 12 months, please make certain to schedule one by calling the office and schedule your medicare wellness with Selena Batten as soon as possible.   Yearly physical (well visit):  - Adults are recommended to be seen yearly for physicals. Check with your insurance and date of your last physical, most insurances require one calendar year between physicals. Physicals include all preventive health topics, screenings, medical exam and labs that are appropriate for gender/age and history. You may have fasting labs needed at this visit. This is a well visit (not a sick visit), new problems should not be covered during this visit (see acute visit).  - Pediatric patients are seen more frequently when they are younger. Your provider will advise you on well child visit timing that is appropriate for your their age. - This is not a medicare wellness visit. Medicare wellness exams do not have an exam portion to the visit. Some medicare companies allow for a physical, some do not allow a yearly physical. If your  medicare allows a yearly physical you can schedule the medicare wellness with our nurse Selena Batten and have your physical with your provider after, on the same day. Please check with insurance for your full benefits.   Late Policy/No Shows:  - all new patients should arrive 15-30 minutes earlier than appointment to allow [place] time  to  obtain all personal demographics,  insurance information and for you to complete office paperwork. - All established patients should arrive 10-15 minutes earlier than appointment time to update all information and be checked in .  - In our best efforts to run on time, if you are late for your appointment you will be asked to either reschedule or if able, we will work you back into the schedule. There will be a wait time to work you back in the schedule,  depending on availability.  - If you are unable to make it to your appointment as scheduled, please call 24 hours ahead of time to allow [place] to fill the time slot with someone else who needs to be seen. If you do not cancel your appointment ahead of time, you may be charged a no show fee.

## 2019-12-21 DIAGNOSIS — M25562 Pain in left knee: Secondary | ICD-10-CM | POA: Insufficient documentation

## 2020-08-14 ENCOUNTER — Other Ambulatory Visit: Payer: Self-pay

## 2020-08-14 ENCOUNTER — Ambulatory Visit (INDEPENDENT_AMBULATORY_CARE_PROVIDER_SITE_OTHER): Payer: No Typology Code available for payment source | Admitting: Family Medicine

## 2020-08-14 ENCOUNTER — Encounter: Payer: Self-pay | Admitting: Family Medicine

## 2020-08-14 VITALS — BP 129/76 | HR 75 | Temp 98.3°F | Ht 70.0 in | Wt 194.0 lb

## 2020-08-14 DIAGNOSIS — E538 Deficiency of other specified B group vitamins: Secondary | ICD-10-CM | POA: Diagnosis not present

## 2020-08-14 DIAGNOSIS — Z79899 Other long term (current) drug therapy: Secondary | ICD-10-CM

## 2020-08-14 DIAGNOSIS — Z131 Encounter for screening for diabetes mellitus: Secondary | ICD-10-CM | POA: Diagnosis not present

## 2020-08-14 DIAGNOSIS — E663 Overweight: Secondary | ICD-10-CM

## 2020-08-14 DIAGNOSIS — R7989 Other specified abnormal findings of blood chemistry: Secondary | ICD-10-CM

## 2020-08-14 DIAGNOSIS — K219 Gastro-esophageal reflux disease without esophagitis: Secondary | ICD-10-CM

## 2020-08-14 DIAGNOSIS — Z0000 Encounter for general adult medical examination without abnormal findings: Secondary | ICD-10-CM | POA: Diagnosis not present

## 2020-08-14 DIAGNOSIS — Z720 Tobacco use: Secondary | ICD-10-CM

## 2020-08-14 DIAGNOSIS — Z130 Encounter for screening for diseases of the blood and blood-forming organs and certain disorders involving the immune mechanism: Secondary | ICD-10-CM | POA: Diagnosis not present

## 2020-08-14 LAB — CBC WITH DIFFERENTIAL/PLATELET
Basophils Absolute: 0.1 10*3/uL (ref 0.0–0.1)
Basophils Relative: 1 % (ref 0.0–3.0)
Eosinophils Absolute: 0.1 10*3/uL (ref 0.0–0.7)
Eosinophils Relative: 1.9 % (ref 0.0–5.0)
HCT: 46.1 % (ref 39.0–52.0)
Hemoglobin: 15.7 g/dL (ref 13.0–17.0)
Lymphocytes Relative: 33.7 % (ref 12.0–46.0)
Lymphs Abs: 2.1 10*3/uL (ref 0.7–4.0)
MCHC: 34.1 g/dL (ref 30.0–36.0)
MCV: 90.4 fl (ref 78.0–100.0)
Monocytes Absolute: 0.6 10*3/uL (ref 0.1–1.0)
Monocytes Relative: 9.2 % (ref 3.0–12.0)
Neutro Abs: 3.3 10*3/uL (ref 1.4–7.7)
Neutrophils Relative %: 54.2 % (ref 43.0–77.0)
Platelets: 267 10*3/uL (ref 150.0–400.0)
RBC: 5.1 Mil/uL (ref 4.22–5.81)
RDW: 13.1 % (ref 11.5–15.5)
WBC: 6.1 10*3/uL (ref 4.0–10.5)

## 2020-08-14 LAB — COMPREHENSIVE METABOLIC PANEL
ALT: 63 U/L — ABNORMAL HIGH (ref 0–53)
AST: 28 U/L (ref 0–37)
Albumin: 4.7 g/dL (ref 3.5–5.2)
Alkaline Phosphatase: 62 U/L (ref 39–117)
BUN: 15 mg/dL (ref 6–23)
CO2: 30 mEq/L (ref 19–32)
Calcium: 9.7 mg/dL (ref 8.4–10.5)
Chloride: 103 mEq/L (ref 96–112)
Creatinine, Ser: 1.02 mg/dL (ref 0.40–1.50)
GFR: 90.66 mL/min (ref >60.00)
Glucose, Bld: 99 mg/dL (ref 70–99)
Potassium: 4.6 mEq/L (ref 3.5–5.1)
Sodium: 138 mEq/L (ref 135–145)
Total Bilirubin: 0.6 mg/dL (ref 0.2–1.2)
Total Protein: 7.1 g/dL (ref 6.0–8.3)

## 2020-08-14 LAB — LIPID PANEL
Cholesterol: 240 mg/dL — ABNORMAL HIGH (ref 0–200)
HDL: 44.4 mg/dL (ref >39.00)
LDL Cholesterol: 168 mg/dL — ABNORMAL HIGH (ref 0–99)
NonHDL: 195.98
Total CHOL/HDL Ratio: 5
Triglycerides: 139 mg/dL (ref 0.0–149.0)
VLDL: 27.8 mg/dL (ref 0.0–40.0)

## 2020-08-14 LAB — HEMOGLOBIN A1C: Hgb A1c MFr Bld: 5.6 % (ref 4.6–6.5)

## 2020-08-14 MED ORDER — OMEPRAZOLE 20 MG PO CPDR: 20.0000 mg | DELAYED_RELEASE_CAPSULE | ORAL | 11 refills | Status: DC | PRN

## 2020-08-14 MED ORDER — DICLOFENAC SODIUM ER 100 MG PO TB24: 100.0000 mg | ORAL_TABLET | Freq: Every day | ORAL | 3 refills | Status: DC

## 2020-08-14 NOTE — Patient Instructions (Signed)

## 2020-08-14 NOTE — Progress Notes (Signed)
This visit occurred during the SARS-CoV-2 public health emergency.  Safety protocols were in place, including screening questions prior to the visit, additional usage of staff PPE, and extensive cleaning of exam room while observing appropriate contact time as indicated for disinfecting solutions.    Patient ID: Grant Hall, male  DOB: September 04, 1975, 45 y.o.   MRN: [id number] Patient Care Team    Relationship Specialty Notifications Start End  Natalia Leatherwood, DO PCP - General Family Medicine  04/13/18   Yates Decamp, MD Consulting Physician Cardiology  04/13/18   Specialists, [facility]  Orthopedic Surgery  04/13/18     Chief Complaint  Patient presents with  . Annual Exam    Pt is fasting;     Subjective: Grant Hall is a 45 y.o. male present for CPE. All past medical history, surgical history, allergies, family history, immunizations, medications and social history were updated in the electronic medical record today. All recent labs, ED visits and hospitalizations within the last year were reviewed.  Health maintenance:  Colonoscopy: no fhx, routine screen 47  Immunizations:  tdap UTD 2020, influenza declined today, covid declined. Infectious disease screening: HIV completed 2020. Hep C declined today.  PSA: No results found for: PSA, pt was counseled on prostate cancer screenings.  Assistive device: none Oxygen EVO:JJKK Patient has a Dental home. Hospitalizations/ED visits: reviewed   Headaches/bruxism Pt reports his chronic headaches that have been present > 10 years are controlled with daily diclofenac. He did not get any benefit from the flexeril. He has not started b12 or using mouth guard.   Prior note: Reports his headaches not improved much, maybe a little bit after trying Flexeril. Low B12 and normal sed rate along with normal vitamin D collected last visit. He again states that his headaches have been chronic for over a decade.  GERD:  Using OTC  prilosec prn. Works well when needed Depression screen [facility] 2/9 08/14/2020 04/13/2018  Decreased Interest 0 0  Down, Depressed, Hopeless 0 0  PHQ - 2 Score 0 0  Altered sleeping - 0  Tired, decreased energy - 0  Change in appetite - 0  Feeling bad or failure about yourself  - 0  Trouble concentrating - 0  Moving slowly or fidgety/restless - 0  Suicidal thoughts - 0  PHQ-9 Score - 0   No flowsheet data found.      No flowsheet data found.   Immunization History  Administered Date(s) Administered  . Tdap 05/11/2018   Past Medical History:  Diagnosis Date  . Chest pain, non-cardiac 04/13/2018  . Frequent headaches   . GERD (gastroesophageal reflux disease)    No Known Allergies Past Surgical History:  Procedure Laterality Date  . JOINT REPLACEMENT     Family History  Problem Relation Age of Onset  . Melanoma Mother   . Diabetes Father   . Heart attack Father    Social History   Social History Narrative   Marital status/children/pets: Married   Education/employment: college educated, employed, Arts development officer:      -smoke alarm in the home:Yes     - wears seatbelt: Yes     - Feels safe in their relationships: Yes    Allergies as of 08/14/2020   No Known Allergies     Medication List       Accurate as of August 14, 2020  9:39 AM. If you have any questions, ask your nurse or doctor.  Diclofenac Sodium CR 100 MG 24 hr tablet Take 1 tablet (100 mg total) by mouth daily.   omeprazole 20 MG capsule Commonly known as: PRILOSEC Take 1 capsule (20 mg total) by mouth as needed.      All past medical history, surgical history, allergies, family history, immunizations andmedications were updated in the EMR today and reviewed under the history and medication portions of their EMR.     No results found for this or any previous visit (from the past 2160 hour(s)).    ROS: 14 pt review of systems performed and negative (unless mentioned in an  HPI)  Objective: BP 129/76   Pulse 75   Temp 98.3 F (36.8 C) (Oral)   Ht 5\' 10"  (1.778 m)   Wt 194 lb (88 kg)   SpO2 97%   BMI 27.84 kg/m  Gen: Afebrile. No acute distress. Nontoxic in appearance, well-developed, well-nourished,  Pleasant ,male. HENT: AT. [place]. Bilateral TM visualized and normal in appearance, normal external auditory canal. MMM, no oral lesions, adequate dentition. Bilateral nares within normal limits. Throat without erythema, ulcerations or exudates. no Cough on exam, no hoarseness on exam. Eyes:Pupils Equal Round Reactive to light, Extraocular movements intact,  Conjunctiva without redness, discharge or icterus. Neck/lymp/endocrine: Supple,no lymphadenopathy, no thyromegaly CV: RRR no murmur, no edema, +2/4 P posterior tibialis pulses.  Chest: CTAB, no wheeze, rhonchi or crackles. normal Respiratory effort. good Air movement. Abd: Soft. flat. NTND. BS present. no Masses palpated. No hepatosplenomegaly. No rebound tenderness or guarding. Skin: no rashes, purpura or petechiae. Warm and well-perfused. Skin intact. Neuro/Msk:  Normal gait. PERLA. EOMi. Alert. Oriented x3.  Cranial nerves II through XII intact. Muscle strength 5/5 upper/lower extremity. DTRs equal bilaterally. Psych: Normal affect, dress and demeanor. Normal speech. Normal thought content and judgment.  No exam data present  Assessment/plan: Grant Hall is a 45 y.o. 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  - tried flexeril without relief.  Continue  diclofenac - encouraged mouth guard use.  - B12 was low/normal encouraged - he was encouraged to start b12 OTC if desired.  -He would rather not have a neurological referral   Gastroesophageal reflux disease, unspecified whether esophagitis present/Current use of proton pump inhibitor - Uses prilosec as needed.   - continue Prilosec PRN Overweight (BMI 25.0-29.9) - Lipid pane Elevated LFTs - Comprehensive metabolic panel Diabetes mellitus screening - Hemoglobin A1c Screening for deficiency anemia - CBC with Differential/Platelet Routine general medical examination at a health care facility Patient was encouraged to exercise greater than 150 minutes a week. Patient was encouraged to choose a diet filled with fresh fruits and vegetables, and lean meats. AVS provided to patient today for education/recommendation on gender specific health and safety maintenance. Colonoscopy: no fhx, routine screen 47  Immunizations:  tdap UTD 2020, influenza declined today, covid declined. Infectious disease screening: HIV completed 2020. Hep C declined today.  PSA: routine screen at 52, no fhx.   Return in about 1 year (around 08/14/2021) for CPE (30 min).  Orders Placed This Encounter  Procedures  . CBC with Differential/Platelet  . Comprehensive metabolic panel  . Hemoglobin A1c  . Lipid panel   Meds ordered this encounter  Medications  . omeprazole (PRILOSEC) 20 MG capsule    Sig: Take 1 capsule (20 mg total) by mouth as needed.    Dispense:  30 capsule    Refill:  11    Hold until you request  . Diclofenac Sodium CR 100 MG 24 hr tablet    Sig: Take 1 tablet (100 mg total) by mouth daily.    Dispense:  90 tablet    Refill:  3   Referral Orders  No referral(s) requested today     Note is dictated utilizing voice recognition software. Although note has been proof read prior to signing, occasional typographical errors still can be missed. If any questions arise, please do not hesitate to call for verification.  Electronically signed by: Felix Pacini, DO [facility] [place]

## 2020-08-15 ENCOUNTER — Telehealth: Payer: Self-pay | Admitting: Family Medicine

## 2020-08-15 ENCOUNTER — Encounter: Payer: Self-pay | Admitting: Family Medicine

## 2020-08-15 DIAGNOSIS — Z8279 Family history of other congenital malformations, deformations and chromosomal abnormalities: Secondary | ICD-10-CM | POA: Insufficient documentation

## 2020-08-15 DIAGNOSIS — E785 Hyperlipidemia, unspecified: Secondary | ICD-10-CM | POA: Insufficient documentation

## 2020-08-15 DIAGNOSIS — Z8249 Family history of ischemic heart disease and other diseases of the circulatory system: Secondary | ICD-10-CM | POA: Insufficient documentation

## 2020-08-15 NOTE — Telephone Encounter (Signed)
Spoke with pt regarding labs and instructions. Sent text for [employer]

## 2020-08-15 NOTE — Telephone Encounter (Signed)
Please call patient kidney function, blood cell counts and electrolytes are normal Diabetes screening/A1c is normal  Cholesterol panel is abnormal.    -  LDL, which is the bad cholesterol is 168.  This is higher than last collection.  With his prior history of smoking and his family history of heart disease in his father, his LDL goal for his gender/age should be below 130 to maintain cardiac health.    -Recommendations:        - Exercise greater than 150 minutes a week of cardiovascular exercise.       -heart healthy diet, low in saturated fat.  Avoid consuming fatty meats. red meats in moderation, cut back on the use of butter and fried foods.  Use olive oil or canola oil, in place of other oils.      -lastly, he would benefit from a low-dose cholesterol lowering medication to help him decrease his cholesterol and provide him with cardiovascular protection from heart disease or stroke.  If he is wanting to add the cardiovascular protection, please advise and I will send in the medication to his pharmacy.  Liver enzyme, called ALT is just mildly elevated, this is not concerning at this level and is likely from his high cholesterol.

## 2021-08-20 ENCOUNTER — Ambulatory Visit (INDEPENDENT_AMBULATORY_CARE_PROVIDER_SITE_OTHER): Payer: No Typology Code available for payment source | Admitting: Family Medicine

## 2021-08-20 ENCOUNTER — Encounter: Payer: Self-pay | Admitting: Family Medicine

## 2021-08-20 VITALS — BP 113/71 | HR 68 | Temp 98.2°F | Ht 69.5 in | Wt 179.0 lb

## 2021-08-20 DIAGNOSIS — Z8249 Family history of ischemic heart disease and other diseases of the circulatory system: Secondary | ICD-10-CM

## 2021-08-20 DIAGNOSIS — R519 Headache, unspecified: Secondary | ICD-10-CM

## 2021-08-20 DIAGNOSIS — E663 Overweight: Secondary | ICD-10-CM

## 2021-08-20 DIAGNOSIS — E782 Mixed hyperlipidemia: Secondary | ICD-10-CM | POA: Diagnosis not present

## 2021-08-20 DIAGNOSIS — G8929 Other chronic pain: Secondary | ICD-10-CM

## 2021-08-20 DIAGNOSIS — Z0000 Encounter for general adult medical examination without abnormal findings: Secondary | ICD-10-CM | POA: Diagnosis not present

## 2021-08-20 DIAGNOSIS — Z1159 Encounter for screening for other viral diseases: Secondary | ICD-10-CM

## 2021-08-20 DIAGNOSIS — G4763 Sleep related bruxism: Secondary | ICD-10-CM | POA: Diagnosis not present

## 2021-08-20 LAB — TSH: TSH: 1.45 u[IU]/mL (ref 0.35–5.50)

## 2021-08-20 LAB — LIPID PANEL
Cholesterol: 228 mg/dL — ABNORMAL HIGH (ref 0–200)
HDL: 43.2 mg/dL (ref >39.00)
LDL Cholesterol: 160 mg/dL — ABNORMAL HIGH (ref 0–99)
NonHDL: 184.49
Total CHOL/HDL Ratio: 5
Triglycerides: 121 mg/dL (ref 0.0–149.0)
VLDL: 24.2 mg/dL (ref 0.0–40.0)

## 2021-08-20 LAB — CBC
HCT: 46.1 % (ref 39.0–52.0)
Hemoglobin: 15.4 g/dL (ref 13.0–17.0)
MCHC: 33.5 g/dL (ref 30.0–36.0)
MCV: 90.5 fl (ref 78.0–100.0)
Platelets: 250 10*3/uL (ref 150.0–400.0)
RBC: 5.1 Mil/uL (ref 4.22–5.81)
RDW: 13.2 % (ref 11.5–15.5)
WBC: 5.1 10*3/uL (ref 4.0–10.5)

## 2021-08-20 LAB — COMPREHENSIVE METABOLIC PANEL
ALT: 32 U/L (ref 0–53)
AST: 22 U/L (ref 0–37)
Albumin: 4.5 g/dL (ref 3.5–5.2)
Alkaline Phosphatase: 55 U/L (ref 39–117)
BUN: 23 mg/dL (ref 6–23)
CO2: 31 mEq/L (ref 19–32)
Calcium: 9.7 mg/dL (ref 8.4–10.5)
Chloride: 102 mEq/L (ref 96–112)
Creatinine, Ser: 1.09 mg/dL (ref 0.40–1.50)
GFR: 83.12 mL/min (ref >60.00)
Glucose, Bld: 95 mg/dL (ref 70–99)
Potassium: 5 mEq/L (ref 3.5–5.1)
Sodium: 138 mEq/L (ref 135–145)
Total Bilirubin: 0.6 mg/dL (ref 0.2–1.2)
Total Protein: 6.9 g/dL (ref 6.0–8.3)

## 2021-08-20 LAB — HEMOGLOBIN A1C: Hgb A1c MFr Bld: 5.8 % (ref 4.6–6.5)

## 2021-08-20 MED ORDER — DICLOFENAC SODIUM ER 100 MG PO TB24
100.0000 mg | ORAL_TABLET | Freq: Every day | ORAL | 3 refills | Status: DC
Start: 2021-08-20 — End: 2022-08-24

## 2021-08-20 NOTE — Progress Notes (Signed)
? ?This visit occurred during the SARS-CoV-2 public health emergency.  Safety protocols were in place, including screening questions prior to the visit, additional usage of staff PPE, and extensive cleaning of exam room while observing appropriate contact time as indicated for disinfecting solutions.  ? ? ?Patient ID: Grant Hall, male  DOB: 11/14/1975, 46 y.o.   MRN: [id number] ?Patient Care Team  ?  Relationship Specialty Notifications Start End  ?Natalia Leatherwood, DO PCP - General Family Medicine  04/13/18   ?Yates Decamp, MD Consulting Physician Cardiology  04/13/18   ?Specialists, [facility]  Orthopedic Surgery  04/13/18   ? ? ?Chief Complaint  ?Patient presents with  ? Annual Exam  ?  Pt is fasting  ? ? ?Subjective: ? ?Grant Hall is a 45 y.o. male present for CPE/CMC ?All past medical history, surgical history, allergies, family history, immunizations, medications and social history were updated in the electronic medical record today. ?All recent labs, ED visits and hospitalizations within the last year were reviewed. ? ?Health maintenance:  ?Colonoscopy: no fhx, routine screen 47  ?Immunizations:  tdap UTD 2020, influenza (encouraged yearly), covid declined. ?Infectious disease screening: HIV completed 2020. Hep C declined ?PSA: No results found for: PSA, pt was counseled on prostate cancer screenings. Routine screen at 52, no fhx. ?Assistive device: none ?Oxygen WUJ:[id number] ?Patient has a Dental home. ?Hospitalizations/ED visits: reviewed ?  ?  ?Headaches/bruxism ?Pt reports his chronic headaches that have been present since his 13s and are much improved, rarely using the diclofenac, but keeps some with him in case.  ?Prior note: ?Reports his headaches not improved much, maybe a little bit after trying Flexeril.  Low B12 and normal sed rate along with normal vitamin D collected last visit.  He again states that his headaches have been chronic for over a decade. ?  ?GERD:  ?Resolved, not using  prilosec any longer ? ? ?  08/20/2021  ?  8:02 AM 08/14/2020  ?  8:56 AM 04/13/2018  ?  9:57 AM  ?Depression screen PHQ 2/9  ?Decreased Interest 0 0 0  ?Down, Depressed, Hopeless 0 0 0  ?PHQ - 2 Score 0 0 0  ?Altered sleeping   0  ?Tired, decreased energy   0  ?Change in appetite   0  ?Feeling bad or failure about yourself    0  ?Trouble concentrating   0  ?Moving slowly or fidgety/restless   0  ?Suicidal thoughts   0  ?PHQ-9 Score   0  ? ?   ? View : No data to display.  ?  ?  ?  ? ?  ?  ?   ? View : No data to display.  ?  ?  ?  ? ? ?Immunization History  ?Administered Date(s) Administered  ? Tdap 05/11/2018  ? ?Past Medical History:  ?Diagnosis Date  ? Chest pain, non-cardiac 04/13/2018  ? Frequent headaches   ? GERD (gastroesophageal reflux disease)   ? ?No Known Allergies ?Past Surgical History:  ?Procedure Laterality Date  ? JOINT REPLACEMENT    ? ?Family History  ?Problem Relation Age of Onset  ? Melanoma Mother   ? Diabetes Father   ? Heart attack Father   ? ?Social History  ? ?Social History Narrative  ? Marital status/children/pets: Married  ? Education/employment: college educated, employed, Transport planner  ? Safety:   ?   -smoke alarm in the home:Yes  ?   - wears seatbelt: Yes  ?   -  Feels safe in their relationships: Yes  ? ? ?Allergies as of 08/20/2021   ?No Known Allergies ?  ? ?  ?Medication List  ?  ? ?  ? Accurate as of August 20, 2021  8:51 AM. If you have any questions, ask your nurse or doctor.  ?  ?  ? ?  ? ?STOP taking these medications   ? ?omeprazole 20 MG capsule ?Commonly known as: PRILOSEC ?Stopped by: Felix Pacini, DO ?  ? ?  ? ?TAKE these medications   ? ?Diclofenac Sodium CR 100 MG 24 hr tablet ?Take 1 tablet (100 mg total) by mouth daily. ?  ? ?  ? ?All past medical history, surgical history, allergies, family history, immunizations andmedications were updated in the EMR today and reviewed under the history and medication portions of their EMR.    ? ?No results found for this or any previous  visit (from the past 2160 hour(s)). ? ?DG Chest 2 View ?IMPRESSION: No active disease. Electronically Signed   By: Alcide Clever M.D.   On: 05/30/2015 16:27  ? ?ROS ?14 pt review of systems performed and negative (unless mentioned in an HPI) ?Objective: ?BP 113/71   Pulse 68   Temp 98.2 ?F (36.8 ?C) (Oral)   Ht 5' 9.5" (1.765 m)   Wt 179 lb (81.2 kg)   SpO2 98%   BMI 26.05 kg/m?  ?Physical Exam ?Constitutional:   ?   General: He is not in acute distress. ?   Appearance: Normal appearance. He is not ill-appearing, toxic-appearing or diaphoretic.  ?HENT:  ?   Head: Normocephalic and atraumatic.  ?   Right Ear: Tympanic membrane, ear canal and external ear normal. There is no impacted cerumen.  ?   Left Ear: Tympanic membrane, ear canal and external ear normal. There is no impacted cerumen.  ?   Nose: Nose normal. No congestion or rhinorrhea.  ?   Mouth/Throat:  ?   Mouth: Mucous membranes are moist.  ?   Pharynx: Oropharynx is clear. No oropharyngeal exudate or posterior oropharyngeal erythema.  ?Eyes:  ?   General: No scleral icterus.    ?   Right eye: No discharge.     ?   Left eye: No discharge.  ?   Extraocular Movements: Extraocular movements intact.  ?   Pupils: Pupils are equal, round, and reactive to light.  ?Cardiovascular:  ?   Rate and Rhythm: Normal rate and regular rhythm.  ?   Pulses: Normal pulses.  ?   Heart sounds: Normal heart sounds. No murmur heard. ?  No friction rub. No gallop.  ?Pulmonary:  ?   Effort: Pulmonary effort is normal. No respiratory distress.  ?   Breath sounds: Normal breath sounds. No stridor. No wheezing, rhonchi or rales.  ?Chest:  ?   Chest wall: No tenderness.  ?Abdominal:  ?   General: Abdomen is flat. Bowel sounds are normal. There is no distension.  ?   Palpations: Abdomen is soft. There is no mass.  ?   Tenderness: There is no abdominal tenderness. There is no right CVA tenderness, left CVA tenderness, guarding or rebound.  ?   Hernia: No hernia is present.   ?Musculoskeletal:     ?   General: No swelling or tenderness. Normal range of motion.  ?   Cervical back: Normal range of motion and neck supple.  ?   Right lower leg: No edema.  ?   Left lower leg: No edema.  ?  Lymphadenopathy:  ?   Cervical: No cervical adenopathy.  ?Skin: ?   General: Skin is warm and dry.  ?   Coloration: Skin is not jaundiced.  ?   Findings: No bruising, lesion or rash.  ?Neurological:  ?   General: No focal deficit present.  ?   Mental Status: He is alert and oriented to person, place, and time. Mental status is at baseline.  ?   Cranial Nerves: No cranial nerve deficit.  ?   Sensory: No sensory deficit.  ?   Motor: No weakness.  ?   Coordination: Coordination normal.  ?   Gait: Gait normal.  ?   Deep Tendon Reflexes: Reflexes normal.  ?Psychiatric:     ?   Mood and Affect: Mood normal.     ?   Behavior: Behavior normal.     ?   Thought Content: Thought content normal.     ?   Judgment: Judgment normal.  ? ? ?No results found. ? ?Assessment/plan: ?Connye Burkitthomas Y Aufiero is a 46 y.o. male present for CPE/CMC ?Chronic nonintractable headache, unspecified headache type/Bruxism, sleep-related ?Much improved, rarely needing med.  ?Continue diclofenac prn ?chronic tension headaches present for decades.  ?- failed therapy:  flexeril  ?- encouraged mouth guard use.  ?-  B12 was low/normal > he was encouraged to start b12 OTC if desired.  ?-He would rather not have a neurological referral  ?  ?Gastroesophageal reflux disease, unspecified whether esophagitis present/Current and long term use of proton pump inhibitor ?resolved ? ?Mixed hyperlipidemia/FH heart disease/BMI 25.1-29.9 ?Watching diet closely now. Last LDL was 168 - declined med.  ?- Comprehensive metabolic panel ?- CBC ?- Hemoglobin A1c ?- Lipid panel ?- TSH ? ?Encounter for hepatitis C screening test for low risk patient ?- Hepatitis C Antibody ? ?Routine general medical examination at a health care facility ?Colonoscopy: no fhx, routine screen 47   ?Immunizations:  tdap UTD 2020, influenza (encouraged yearly), covid declined. ?Infectious disease screening: HIV completed 2020. Hep C declined ?PSA: No results found for: PSA, pt was counseled on prostate cancer screenings

## 2021-08-20 NOTE — Patient Instructions (Signed)
? ?Great to see you today.  ?I have refilled the medication(s) we provide.  ? ?If labs were collected, we will inform you of lab results once received either by echart message or telephone call.  ? - echart message- for normal results that have been seen by the patient already.  ? - telephone call: abnormal results or if patient has not viewed results in their echart. ?No follow-ups on file. ? ?Health Maintenance, Male ?Adopting a healthy lifestyle and getting preventive care are important in promoting health and wellness. Ask your health care provider about: ?The right schedule for you to have regular tests and exams. ?Things you can do on your own to prevent diseases and keep yourself healthy. ?What should I know about diet, weight, and exercise? ?Eat a healthy diet ? ?Eat a diet that includes plenty of vegetables, fruits, low-fat dairy products, and lean protein. ?Do not eat a lot of foods that are high in solid fats, added sugars, or sodium. ?Maintain a healthy weight ?Body mass index (BMI) is a measurement that can be used to identify possible weight problems. It estimates body fat based on height and weight. Your health care provider can help determine your BMI and help you achieve or maintain a healthy weight. ?Get regular exercise ?Get regular exercise. This is one of the most important things you can do for your health. Most adults should: ?Exercise for at least 150 minutes each week. The exercise should increase your heart rate and make you sweat (moderate-intensity exercise). ?Do strengthening exercises at least twice a week. This is in addition to the moderate-intensity exercise. ?Spend less time sitting. Even light physical activity can be beneficial. ?Watch cholesterol and blood lipids ?Have your blood tested for lipids and cholesterol at 44 years of age, then have this test every 5 years. ?You may need to have your cholesterol levels checked more often if: ?Your lipid or cholesterol levels are  high. ?You are older than 44 years of age. ?You are at high risk for heart disease. ?What should I know about cancer screening? ?Many types of cancers can be detected early and may often be prevented. Depending on your health history and family history, you may need to have cancer screening at various ages. This may include screening for: ?Colorectal cancer. ?Prostate cancer. ?Skin cancer. ?Lung cancer. ?What should I know about heart disease, diabetes, and high blood pressure? ?Blood pressure and heart disease ?High blood pressure causes heart disease and increases the risk of stroke. This is more likely to develop in people who have high blood pressure readings or are overweight. ?Talk with your health care provider about your target blood pressure readings. ?Have your blood pressure checked: ?Every 3-5 years if you are 22-68 years of age. ?Every year if you are 25 years old or older. ?If you are between the ages of 75 and 26 and are a current or former smoker, ask your health care provider if you should have a one-time screening for abdominal aortic aneurysm (AAA). ?Diabetes ?Have regular diabetes screenings. This checks your fasting blood sugar level. Have the screening done: ?Once every three years after age 45 if you are at a normal weight and have a low risk for diabetes. ?More often and at a younger age if you are overweight or have a high risk for diabetes. ?What should I know about preventing infection? ?Hepatitis B ?If you have a higher risk for hepatitis B, you should be screened for this virus. Talk with your health  care provider to find out if you are at risk for hepatitis B infection. ?Hepatitis C ?Blood testing is recommended for: ?Everyone born from [birth year] through 1963. ?Anyone with known risk factors for hepatitis C. ?Sexually transmitted infections (STIs) ?You should be screened each year for STIs, including gonorrhea and chlamydia, if: ?You are sexually active and are younger than 44 years of  age. ?You are older than 44 years of age and your health care provider tells you that you are at risk for this type of infection. ?Your sexual activity has changed since you were last screened, and you are at increased risk for chlamydia or gonorrhea. Ask your health care provider if you are at risk. ?Ask your health care provider about whether you are at high risk for HIV. Your health care provider may recommend a prescription medicine to help prevent HIV infection. If you choose to take medicine to prevent HIV, you should first get tested for HIV. You should then be tested every 3 months for as long as you are taking the medicine. ?Follow these instructions at home: ?Alcohol use ?Do not drink alcohol if your health care provider tells you not to drink. ?If you drink alcohol: ?Limit how much you have to 0-2 drinks a day. ?Know how much alcohol is in your drink. In the [place] one drink equals one 12 oz bottle of beer (355 mL), one 5 oz glass of wine (148 mL), or one 1? oz glass of hard liquor (44 mL). ?Lifestyle ?Do not use any products that contain nicotine or tobacco. These products include cigarettes, chewing tobacco, and vaping devices, such as e-cigarettes. If you need help quitting, ask your health care provider. ?Do not use street drugs. ?Do not share needles. ?Ask your health care provider for help if you need support or information about quitting drugs. ?General instructions ?Schedule regular health, dental, and eye exams. ?Stay current with your vaccines. ?Tell your health care provider if: ?You often feel depressed. ?You have ever been abused or do not feel safe at home. ?Summary ?Adopting a healthy lifestyle and getting preventive care are important in promoting health and wellness. ?Follow your health care provider's instructions about healthy diet, exercising, and getting tested or screened for diseases. ?Follow your health care provider's instructions on monitoring your cholesterol and blood  pressure. ?This information is not intended to replace advice given to you by your health care provider. Make sure you discuss any questions you have with your health care provider. ?Document Revised: 09/15/2020 Document Reviewed: 09/15/2020 ?Elsevier Patient Education ? 2022 [employer] ? ?

## 2021-08-21 ENCOUNTER — Telehealth: Payer: Self-pay | Admitting: Family Medicine

## 2021-08-21 LAB — HEPATITIS C ANTIBODY
Hepatitis C Ab: NONREACTIVE
SIGNAL TO CUT-OFF: 0.13 (ref <1.00)

## 2021-08-21 NOTE — Telephone Encounter (Signed)
Please call patient ?Liver, kidney and thyroid function are normal ?Blood cell counts and electrolytes are normal ?Diabetes screening/A1c is normal  ?Cholesterol panel is still about the same with an elevated bad cholesterol/LDL of 160.  He had reported changing his diet and making healthier choices.  The LDL/bad cholesterol elevation is likely genetic for him.  Course would encourage him to keep up the heart healthy diet and exercise, but I would recommend starting a medication to help lowering cholesterol and provide him with cardiovascular protection. ? ?If he is agreeable please call in atorvastatin 20 mg nightly ?

## 2021-08-21 NOTE — Telephone Encounter (Signed)
Spoke with pt regarding labs and instructions. Pt will CB regarding meds. ?

## 2022-08-26 ENCOUNTER — Encounter: Payer: Self-pay | Admitting: Family Medicine

## 2022-08-26 ENCOUNTER — Ambulatory Visit (INDEPENDENT_AMBULATORY_CARE_PROVIDER_SITE_OTHER): Payer: Commercial Managed Care - PPO | Admitting: Family Medicine

## 2022-08-26 VITALS — BP 118/80 | HR 79 | Temp 97.6°F | Ht 69.29 in | Wt 196.2 lb

## 2022-08-26 DIAGNOSIS — Z0000 Encounter for general adult medical examination without abnormal findings: Secondary | ICD-10-CM | POA: Diagnosis not present

## 2022-08-26 DIAGNOSIS — Z131 Encounter for screening for diabetes mellitus: Secondary | ICD-10-CM | POA: Diagnosis not present

## 2022-08-26 DIAGNOSIS — G44229 Chronic tension-type headache, not intractable: Secondary | ICD-10-CM | POA: Diagnosis not present

## 2022-08-26 DIAGNOSIS — Z8249 Family history of ischemic heart disease and other diseases of the circulatory system: Secondary | ICD-10-CM | POA: Diagnosis not present

## 2022-08-26 DIAGNOSIS — E782 Mixed hyperlipidemia: Secondary | ICD-10-CM | POA: Diagnosis not present

## 2022-08-26 LAB — CBC WITH DIFFERENTIAL/PLATELET
Basophils Absolute: 0 10*3/uL (ref 0.0–0.1)
Basophils Relative: 0.6 % (ref 0.0–3.0)
Eosinophils Absolute: 0.2 10*3/uL (ref 0.0–0.7)
Eosinophils Relative: 2.8 % (ref 0.0–5.0)
HCT: 46.9 % (ref 39.0–52.0)
Hemoglobin: 16.1 g/dL (ref 13.0–17.0)
Lymphocytes Relative: 28.7 % (ref 12.0–46.0)
Lymphs Abs: 1.9 10*3/uL (ref 0.7–4.0)
MCHC: 34.3 g/dL (ref 30.0–36.0)
MCV: 91 fl (ref 78.0–100.0)
Monocytes Absolute: 0.6 10*3/uL (ref 0.1–1.0)
Monocytes Relative: 8.6 % (ref 3.0–12.0)
Neutro Abs: 3.9 10*3/uL (ref 1.4–7.7)
Neutrophils Relative %: 59.3 % (ref 43.0–77.0)
Platelets: 262 10*3/uL (ref 150.0–400.0)
RBC: 5.16 Mil/uL (ref 4.22–5.81)
RDW: 12.7 % (ref 11.5–15.5)
WBC: 6.6 10*3/uL (ref 4.0–10.5)

## 2022-08-26 LAB — TSH: TSH: 1.86 u[IU]/mL (ref 0.35–5.50)

## 2022-08-26 LAB — COMPREHENSIVE METABOLIC PANEL
ALT: 47 U/L (ref 0–53)
AST: 24 U/L (ref 0–37)
Albumin: 4.6 g/dL (ref 3.5–5.2)
Alkaline Phosphatase: 62 U/L (ref 39–117)
BUN: 18 mg/dL (ref 6–23)
CO2: 28 mEq/L (ref 19–32)
Calcium: 9.5 mg/dL (ref 8.4–10.5)
Chloride: 102 mEq/L (ref 96–112)
Creatinine, Ser: 1.04 mg/dL (ref 0.40–1.50)
GFR: 87.31 mL/min (ref >60.00)
Glucose, Bld: 101 mg/dL — ABNORMAL HIGH (ref 70–99)
Potassium: 4.5 mEq/L (ref 3.5–5.1)
Sodium: 139 mEq/L (ref 135–145)
Total Bilirubin: 0.6 mg/dL (ref 0.2–1.2)
Total Protein: 7.2 g/dL (ref 6.0–8.3)

## 2022-08-26 LAB — LIPID PANEL
Cholesterol: 234 mg/dL — ABNORMAL HIGH (ref 0–200)
HDL: 43.1 mg/dL (ref >39.00)
LDL Cholesterol: 163 mg/dL — ABNORMAL HIGH (ref 0–99)
NonHDL: 191.32
Total CHOL/HDL Ratio: 5
Triglycerides: 142 mg/dL (ref 0.0–149.0)
VLDL: 28.4 mg/dL (ref 0.0–40.0)

## 2022-08-26 LAB — HEMOGLOBIN A1C: Hgb A1c MFr Bld: 5.6 % (ref 4.6–6.5)

## 2022-08-26 MED ORDER — DICLOFENAC SODIUM ER 100 MG PO TB24: 100.0000 mg | ORAL_TABLET | Freq: Every day | ORAL | 3 refills | Status: DC

## 2022-08-26 NOTE — Progress Notes (Signed)
Patient ID: Grant Hall, male  DOB: [birth year], 47 y.o.   MRN: [id number] Patient Care Team    Relationship Specialty Notifications Start End  Natalia Leatherwood, DO PCP - General Family Medicine  04/13/18   Yates Decamp, MD Consulting Physician Cardiology  04/13/18   Specialists, [facility]  Orthopedic Surgery  04/13/18     Chief Complaint  Patient presents with   Annual Exam    [facility]; pt is fasting    Subjective:  Grant Hall is a 47 y.o. male present for CPE  All past medical history, surgical history, allergies, family history, immunizations, medications and social history were updated in the electronic medical record today. All recent labs, ED visits and hospitalizations within the last year were reviewed.  Health maintenance:  Colonoscopy: no fhx, routine screen 47 (2025 cpe) Immunizations:  tdap UTD 2020, influenza (encouraged yearly),  Infectious disease screening: HIV completed 2020. Hep C declined PSA: No results found for: PSA, pt was counseled on prostate cancer screenings. Routine screen at 52, no fhx. Patient has a Dental home. Hospitalizations/ED visits: reviewed  Headaches/bruxism Pt reports his chronic headaches that have been present since his 46s and are much improved, rarely using the diclofenac, but keeps some with him in case.  Prior note: Reports his headaches not improved much, maybe a little bit after trying Flexeril.  Low B12 and normal sed rate along with normal vitamin D collected last visit.  He again states that his headaches have been chronic for over a decade.      08/26/2022    8:01 AM 08/20/2021    8:02 AM 08/14/2020    8:56 AM 04/13/2018    9:57 AM  Depression screen PHQ 2/9  Decreased Interest 0 0 0 0  Down, Depressed, Hopeless 0 0 0 0  PHQ - 2 Score 0 0 0 0  Altered sleeping    0  Tired, decreased energy    0  Change in appetite    0  Feeling bad or failure about yourself     0  Trouble concentrating    0  Moving slowly  or fidgety/restless    0  Suicidal thoughts    0  PHQ-9 Score    0       No data to display                08/26/2022    8:00 AM  Fall Risk   Falls in the past year? 0  Number falls in past yr: 0  Injury with Fall? 0    Immunization History  Administered Date(s) Administered   Tdap 05/11/2018   Past Medical History:  Diagnosis Date   Chest pain, non-cardiac 04/13/2018   Current use of proton pump inhibitor 04/13/2018   Frequent headaches    GERD (gastroesophageal reflux disease)    No Known Allergies Past Surgical History:  Procedure Laterality Date   JOINT REPLACEMENT     Family History  Problem Relation Age of Onset   Melanoma Mother    Diabetes Father    Heart attack Father    Social History   Social History Narrative   Marital status/children/pets: Married   Education/employment: college educated, employed, Arts development officer:      -smoke alarm in the home:Yes     - wears seatbelt: Yes     - Feels safe in their relationships: Yes    Allergies as of 08/26/2022  No Known Allergies      Medication List        Accurate as of August 26, 2022  8:44 AM. If you have any questions, ask your nurse or doctor.          Diclofenac Sodium CR 100 MG 24 hr tablet Take 1 tablet (100 mg total) by mouth daily.       All past medical history, surgical history, allergies, family history, immunizations andmedications were updated in the EMR today and reviewed under the history and medication portions of their EMR.     No results found for this or any previous visit (from the past 2160 hour(s)).  ROS 14 pt review of systems performed and negative (unless mentioned in an HPI) Objective: BP 118/80   Pulse 79   Temp 97.6 F (36.4 C)   Ht 5' 9.29" (1.76 m)   Wt 196 lb 3.2 oz (89 kg)   SpO2 98%   BMI 28.73 kg/m  Physical Exam Constitutional:      General: He is not in acute distress.    Appearance: Normal appearance. He is not ill-appearing,  toxic-appearing or diaphoretic.  HENT:     Head: Normocephalic and atraumatic.     Right Ear: Tympanic membrane, ear canal and external ear normal. There is no impacted cerumen.     Left Ear: Tympanic membrane, ear canal and external ear normal. There is no impacted cerumen.     Nose: Nose normal. No congestion or rhinorrhea.     Mouth/Throat:     Mouth: Mucous membranes are moist.     Pharynx: Oropharynx is clear. No oropharyngeal exudate or posterior oropharyngeal erythema.  Eyes:     General: No scleral icterus.       Right eye: No discharge.        Left eye: No discharge.     Extraocular Movements: Extraocular movements intact.     Pupils: Pupils are equal, round, and reactive to light.  Cardiovascular:     Rate and Rhythm: Normal rate and regular rhythm.     Pulses: Normal pulses.     Heart sounds: Normal heart sounds. No murmur heard.    No friction rub. No gallop.  Pulmonary:     Effort: Pulmonary effort is normal. No respiratory distress.     Breath sounds: Normal breath sounds. No stridor. No wheezing, rhonchi or rales.  Chest:     Chest wall: No tenderness.  Abdominal:     General: Abdomen is flat. Bowel sounds are normal. There is no distension.     Palpations: Abdomen is soft. There is no mass.     Tenderness: There is no abdominal tenderness. There is no right CVA tenderness, left CVA tenderness, guarding or rebound.     Hernia: No hernia is present.  Musculoskeletal:        General: No swelling or tenderness. Normal range of motion.     Cervical back: Normal range of motion and neck supple.     Right lower leg: No edema.     Left lower leg: No edema.  Lymphadenopathy:     Cervical: No cervical adenopathy.  Skin:    General: Skin is warm and dry.     Coloration: Skin is not jaundiced.     Findings: No bruising, lesion or rash.  Neurological:     General: No focal deficit present.     Mental Status: He is alert and oriented to person, place, and time. Mental  status  is at baseline.     Cranial Nerves: No cranial nerve deficit.     Sensory: No sensory deficit.     Motor: No weakness.     Coordination: Coordination normal.     Gait: Gait normal.     Deep Tendon Reflexes: Reflexes normal.  Psychiatric:        Mood and Affect: Mood normal.        Behavior: Behavior normal.        Thought Content: Thought content normal.        Judgment: Judgment normal.     No results found.  Assessment/plan: Grant Hall is a 47 y.o. male present for CPE Chronic nonintractable headache, unspecified headache type/Bruxism, sleep-related Much improved, rarely needing med.  Continue  diclofenac prn chronic tension headaches present for decades.  - failed therapy:  flexeril  - encouraged mouth guard use.  -  B12 was low/normal > he was encouraged to start b12 OTC if desired.  -He would rather not have a neurological referral   Mixed hyperlipidemia/FH heart disease/BMI 25.1-29.9 Watching diet closely now. Last LDL was 168 - declined med.  Lipid collected today Offered/discussed cardiac CT> pt would like to have screening  Routine general medical examination at a health care facility Patient was encouraged to exercise greater than 150 minutes a week. Patient was encouraged to choose a diet filled with fresh fruits and vegetables, and lean meats. AVS provided to patient today for education/recommendation on gender specific health and safety maintenance. Colonoscopy: no fhx, routine screen 47 (2025 cpe) Immunizations:  tdap UTD 2020, influenza (encouraged yearly),  Infectious disease screening: HIV completed 2020. Hep C declined PSA: No results found for: PSA, pt was counseled on prostate cancer screenings. Routine screen at 52, no fhx.  Return in about 1 year (around 08/27/2023) for cpe (20 min).   Orders Placed This Encounter  Procedures   CT CARDIAC SCORING (SELF PAY ONLY)   CBC with Differential/Platelet   Hemoglobin A1c   Comprehensive metabolic  panel   Lipid panel   TSH   Meds ordered this encounter  Medications   Diclofenac Sodium CR 100 MG 24 hr tablet    Sig: Take 1 tablet (100 mg total) by mouth daily.    Dispense:  90 tablet    Refill:  3    Hold until patient request   Referral Orders  No referral(s) requested today     Note is dictated utilizing voice recognition software. Although note has been proof read prior to signing, occasional typographical errors still can be missed. If any questions arise, please do not hesitate to call for verification.  Electronically signed by: Felix Pacini, DO [facility] [place]

## 2022-08-26 NOTE — Patient Instructions (Addendum)
Return in about 1 year (around 08/27/2023) for cpe (20 min).        Great to see you today.    If labs were collected, we will inform you of lab results once received either by echart message or telephone call.   - echart message- for normal results that have been seen by the patient already.   - telephone call: abnormal results or if patient has not viewed results in their echart.

## 2022-09-02 ENCOUNTER — Ambulatory Visit (INDEPENDENT_AMBULATORY_CARE_PROVIDER_SITE_OTHER): Payer: Self-pay

## 2022-09-02 DIAGNOSIS — Z8249 Family history of ischemic heart disease and other diseases of the circulatory system: Secondary | ICD-10-CM

## 2022-09-02 DIAGNOSIS — E782 Mixed hyperlipidemia: Secondary | ICD-10-CM

## 2022-09-03 ENCOUNTER — Telehealth: Payer: Self-pay | Admitting: Family Medicine

## 2022-09-03 NOTE — Telephone Encounter (Signed)
Spoke with patient regarding results/recommendations.  

## 2022-09-03 NOTE — Telephone Encounter (Signed)
Would like to now start a medication for his cholesterol it would be either Crestor or Lipitor depending upon which insurance prefers.

## 2022-09-03 NOTE — Telephone Encounter (Signed)
Pt would like to know which medication you would like for him to take for his cholesterol.

## 2022-09-03 NOTE — Telephone Encounter (Signed)
Please inform patient His cardiac CT score is 0.  This is the best possible score for this test. This means he has no appreciable plaque/cholesterol buildup in the arteries surrounding his heart.  Would recommending repeating this every 5 years, especially with his elevated LDL.

## 2022-09-06 NOTE — Telephone Encounter (Signed)
Spoke with patient regarding results/recommendations.  

## 2022-09-06 NOTE — Telephone Encounter (Signed)
Pt requested that he be called back in a few hours

## 2023-09-01 ENCOUNTER — Ambulatory Visit (INDEPENDENT_AMBULATORY_CARE_PROVIDER_SITE_OTHER): Payer: Commercial Managed Care - PPO | Admitting: Family Medicine

## 2023-09-01 ENCOUNTER — Encounter: Payer: Self-pay | Admitting: Family Medicine

## 2023-09-01 VITALS — BP 120/78 | HR 86 | Temp 98.2°F | Ht 69.0 in | Wt 198.2 lb

## 2023-09-01 DIAGNOSIS — R519 Headache, unspecified: Secondary | ICD-10-CM

## 2023-09-01 DIAGNOSIS — Z0000 Encounter for general adult medical examination without abnormal findings: Secondary | ICD-10-CM | POA: Diagnosis not present

## 2023-09-01 DIAGNOSIS — Z1211 Encounter for screening for malignant neoplasm of colon: Secondary | ICD-10-CM

## 2023-09-01 DIAGNOSIS — R5383 Other fatigue: Secondary | ICD-10-CM

## 2023-09-01 DIAGNOSIS — G8929 Other chronic pain: Secondary | ICD-10-CM

## 2023-09-01 DIAGNOSIS — Z8249 Family history of ischemic heart disease and other diseases of the circulatory system: Secondary | ICD-10-CM | POA: Diagnosis not present

## 2023-09-01 DIAGNOSIS — E782 Mixed hyperlipidemia: Secondary | ICD-10-CM | POA: Diagnosis not present

## 2023-09-01 LAB — B12 AND FOLATE PANEL
Folate: >25.2 ng/mL (ref >5.9)
Vitamin B-12: 496 pg/mL (ref 211–911)

## 2023-09-01 LAB — CBC
HCT: 47.9 % (ref 39.0–52.0)
Hemoglobin: 16.2 g/dL (ref 13.0–17.0)
MCHC: 33.7 g/dL (ref 30.0–36.0)
MCV: 92.6 fl (ref 78.0–100.0)
Platelets: 271 10*3/uL (ref 150.0–400.0)
RBC: 5.17 Mil/uL (ref 4.22–5.81)
RDW: 13 % (ref 11.5–15.5)
WBC: 5.6 10*3/uL (ref 4.0–10.5)

## 2023-09-01 LAB — HEMOGLOBIN A1C: Hgb A1c MFr Bld: 5.7 % (ref 4.6–6.5)

## 2023-09-01 LAB — COMPREHENSIVE METABOLIC PANEL WITH GFR
ALT: 67 U/L — ABNORMAL HIGH (ref 0–53)
AST: 28 U/L (ref 0–37)
Albumin: 4.5 g/dL (ref 3.5–5.2)
Alkaline Phosphatase: 62 U/L (ref 39–117)
BUN: 20 mg/dL (ref 6–23)
CO2: 29 meq/L (ref 19–32)
Calcium: 9.8 mg/dL (ref 8.4–10.5)
Chloride: 102 meq/L (ref 96–112)
Creatinine, Ser: 1.05 mg/dL (ref 0.40–1.50)
GFR: 85.7 mL/min (ref >60.00)
Glucose, Bld: 104 mg/dL — ABNORMAL HIGH (ref 70–99)
Potassium: 4.7 meq/L (ref 3.5–5.1)
Sodium: 137 meq/L (ref 135–145)
Total Bilirubin: 0.7 mg/dL (ref 0.2–1.2)
Total Protein: 7.3 g/dL (ref 6.0–8.3)

## 2023-09-01 LAB — TSH: TSH: 1.87 u[IU]/mL (ref 0.35–5.50)

## 2023-09-01 LAB — LIPID PANEL
Cholesterol: 254 mg/dL — ABNORMAL HIGH (ref 0–200)
HDL: 40.9 mg/dL (ref >39.00)
LDL Cholesterol: 183 mg/dL — ABNORMAL HIGH (ref 0–99)
NonHDL: 212.98
Total CHOL/HDL Ratio: 6
Triglycerides: 150 mg/dL — ABNORMAL HIGH (ref 0.0–149.0)
VLDL: 30 mg/dL (ref 0.0–40.0)

## 2023-09-01 LAB — TESTOSTERONE: Testosterone: 338.26 ng/dL (ref 300.00–890.00)

## 2023-09-01 LAB — VITAMIN D 25 HYDROXY (VIT D DEFICIENCY, FRACTURES): VITD: 34.45 ng/mL (ref 30.00–100.00)

## 2023-09-01 MED ORDER — DICLOFENAC SODIUM ER 100 MG PO TB24: 100.0000 mg | ORAL_TABLET | Freq: Every day | ORAL | 3 refills | Status: AC

## 2023-09-01 NOTE — Progress Notes (Signed)
 Patient ID: Grant Hall, male  DOB: 03-15-1976, 48 y.o.   MRN: [id number] Patient Care Team    Relationship Specialty Notifications Start End  Mariel Shope, DO PCP - General Family Medicine  04/13/18   Knox Perl, MD Consulting Physician Cardiology  04/13/18   Specialists, [facility]  Orthopedic Surgery  04/13/18     Chief Complaint  Patient presents with   Annual Exam    Pt is fasting.     Subjective:  Grant Hall is a 48 y.o. male present for CPE and acute concern All past medical history, surgical history, allergies, family history, immunizations, medications and social history were updated in the electronic medical record today. All recent labs, ED visits and hospitalizations within the last year were reviewed.   Health maintenance:  Colon cancer screen: no fhx, routine screen 47 Immunizations:  tdap UTD 2020, influenza (encouraged yearly),  Infectious disease screening: HIV completed 2020. Hep C completed PSA: No results found for: PSA, pt was counseled on prostate cancer screenings. Routine screen at 52, no fhx. Patient has a Dental home. Hospitalizations/ED visits: Reviewed  Headaches/bruxism Pt reports his chronic headaches that have been present since his 64s and are much improved, rarely using the diclofenac , but keeps some with him in case.  Prior note: Reports his headaches not improved much, maybe a little bit after trying Flexeril .  Low B12 and normal sed rate along with normal vitamin D  collected last visit.  He again states that his headaches have been chronic for over a decade.  Fatigue: does not have same energy he once had. Feeling fatigued all the time.  Does not think he snores, he is restless. He does take b12 and vit d.       08/26/2022    8:01 AM 08/20/2021    8:02 AM 08/14/2020    8:56 AM 04/13/2018    9:57 AM  Depression screen PHQ 2/9  Decreased Interest 0 0 0 0  Down, Depressed, Hopeless 0 0 0 0  PHQ - 2 Score 0 0 0 0   Altered sleeping    0  Tired, decreased energy    0  Change in appetite    0  Feeling bad or failure about yourself     0  Trouble concentrating    0  Moving slowly or fidgety/restless    0  Suicidal thoughts    0  PHQ-9 Score    0       No data to display                08/26/2022    8:00 AM  Fall Risk   Falls in the past year? 0  Number falls in past yr: 0  Injury with Fall? 0    Immunization History  Administered Date(s) Administered   Tdap 05/11/2018   Past Medical History:  Diagnosis Date   Chest pain, non-cardiac 04/13/2018   Current use of proton pump inhibitor 04/13/2018   Frequent headaches    GERD (gastroesophageal reflux disease)    No Known Allergies Past Surgical History:  Procedure Laterality Date   JOINT REPLACEMENT     Family History  Problem Relation Age of Onset   Melanoma Mother    Diabetes Father    Heart attack Father    Social History   Social History Narrative   Marital status/children/pets: Married   Education/employment: college educated, employed, Arts development officer:      -  smoke alarm in the home:Yes     - wears seatbelt: Yes     - Feels safe in their relationships: Yes    Allergies as of 09/01/2023   No Known Allergies      Medication List        Accurate as of September 01, 2023  8:13 AM. If you have any questions, ask your nurse or doctor.          Diclofenac  Sodium CR 100 MG 24 hr tablet Take 1 tablet (100 mg total) by mouth daily.       All past medical history, surgical history, allergies, family history, immunizations andmedications were updated in the EMR today and reviewed under the history and medication portions of their EMR.     No results found for this or any previous visit (from the past 2160 hours).  ROS 14 pt review of systems performed and negative (unless mentioned in an HPI) Objective: BP 120/78   Pulse 86   Temp 98.2 F (36.8 C)   Ht 5\' 9"  (1.753 m)   Wt 198 lb 3.2 oz (89.9 kg)    SpO2 98%   BMI 29.27 kg/m  Physical Exam Constitutional:      General: He is not in acute distress.    Appearance: Normal appearance. He is not ill-appearing, toxic-appearing or diaphoretic.  HENT:     Head: Normocephalic and atraumatic.     Right Ear: Tympanic membrane, ear canal and external ear normal. There is no impacted cerumen.     Left Ear: Tympanic membrane, ear canal and external ear normal. There is no impacted cerumen.     Nose: Nose normal. No congestion or rhinorrhea.     Mouth/Throat:     Mouth: Mucous membranes are moist.     Pharynx: Oropharynx is clear. No oropharyngeal exudate or posterior oropharyngeal erythema.  Eyes:     General: No scleral icterus.       Right eye: No discharge.        Left eye: No discharge.     Extraocular Movements: Extraocular movements intact.     Pupils: Pupils are equal, round, and reactive to light.  Cardiovascular:     Rate and Rhythm: Normal rate and regular rhythm.     Pulses: Normal pulses.     Heart sounds: Normal heart sounds. No murmur heard.    No friction rub. No gallop.  Pulmonary:     Effort: Pulmonary effort is normal. No respiratory distress.     Breath sounds: Normal breath sounds. No stridor. No wheezing, rhonchi or rales.  Chest:     Chest wall: No tenderness.  Abdominal:     General: Abdomen is flat. Bowel sounds are normal. There is no distension.     Palpations: Abdomen is soft. There is no mass.     Tenderness: There is no abdominal tenderness. There is no right CVA tenderness, left CVA tenderness, guarding or rebound.     Hernia: No hernia is present.  Musculoskeletal:        General: No swelling or tenderness. Normal range of motion.     Cervical back: Normal range of motion and neck supple.     Right lower leg: No edema.     Left lower leg: No edema.  Lymphadenopathy:     Cervical: No cervical adenopathy.  Skin:    General: Skin is warm and dry.     Coloration: Skin is not jaundiced.     Findings:  No  bruising, lesion or rash.  Neurological:     General: No focal deficit present.     Mental Status: He is alert and oriented to person, place, and time. Mental status is at baseline.     Cranial Nerves: No cranial nerve deficit.     Sensory: No sensory deficit.     Motor: No weakness.     Coordination: Coordination normal.     Gait: Gait normal.     Deep Tendon Reflexes: Reflexes normal.  Psychiatric:        Mood and Affect: Mood normal.        Behavior: Behavior normal.        Thought Content: Thought content normal.        Judgment: Judgment normal.     No results found.  Assessment/plan: Grant Hall is a 48 y.o. male present for CPE Chronic nonintractable headache, unspecified headache type/Bruxism, sleep-related Much improved, rarely needing med.  Continue diclofenac  prn chronic tension headaches present for decades.  - failed therapy:  flexeril   - encouraged mouth guard use.  -  B12 was low/normal > he was encouraged to start b12 OTC if desired.  -He would rather not have a neurological referral   Mixed hyperlipidemia/FH heart disease/BMI 25.1-29.9 Watching diet closely now. Last LDL was 168 - declined med.  Lipid and a1c collected today Cardiac CT score 08/2022: ZERO  Colon cancer screening - Cologuard  Other fatigue - Testosterone  - Vitamin D  (25 hydroxy) - B12 and Folate Panel   Routine general medical examination at a health care facility Patient was encouraged to exercise greater than 150 minutes a week. Patient was encouraged to choose a diet filled with fresh fruits and vegetables, and lean meats. AVS provided to patient today for education/recommendation on gender specific health and safety maintenance. Colon cancer screen: no fhx, routine screen 47-cologuard ordered Immunizations:  tdap UTD 2020, influenza (encouraged yearly),  Infectious disease screening: HIV completed 2020. Hep C completed PSA: No results found for: PSA, pt was counseled on  prostate cancer screenings. Routine screen at 52, no fhx.  No follow-ups on file.   Orders Placed This Encounter  Procedures   CBC   Comprehensive metabolic panel with GFR   Hemoglobin A1c   Lipid panel   TSH   No orders of the defined types were placed in this encounter.  Referral Orders  No referral(s) requested today     Note is dictated utilizing voice recognition software. Although note has been proof read prior to signing, occasional typographical errors still can be missed. If any questions arise, please do not hesitate to call for verification.  Electronically signed by: Napolean Backbone, DO [facility] [place]

## 2023-09-01 NOTE — Patient Instructions (Addendum)

## 2023-09-02 ENCOUNTER — Encounter: Payer: Self-pay | Admitting: Family Medicine

## 2024-09-06 ENCOUNTER — Encounter: Admitting: Family Medicine
